# Patient Record
Sex: Male | Born: 1945 | Race: White | Hispanic: No | Marital: Married | Smoking: Never smoker
Health system: Southern US, Community
[De-identification: ages and names within clinical notes are randomized; demographics above are authoritative.]

## PROBLEM LIST (undated history)

## (undated) DIAGNOSIS — I4891 Unspecified atrial fibrillation: Secondary | ICD-10-CM

## (undated) DIAGNOSIS — I10 Essential (primary) hypertension: Secondary | ICD-10-CM

## (undated) DIAGNOSIS — I739 Peripheral vascular disease, unspecified: Secondary | ICD-10-CM

## (undated) DIAGNOSIS — I639 Cerebral infarction, unspecified: Secondary | ICD-10-CM

## (undated) DIAGNOSIS — F419 Anxiety disorder, unspecified: Secondary | ICD-10-CM

## (undated) DIAGNOSIS — E119 Type 2 diabetes mellitus without complications: Secondary | ICD-10-CM

## (undated) DIAGNOSIS — E785 Hyperlipidemia, unspecified: Secondary | ICD-10-CM

## (undated) DIAGNOSIS — I499 Cardiac arrhythmia, unspecified: Secondary | ICD-10-CM

## (undated) HISTORY — DX: Anxiety disorder, unspecified: F41.9

## (undated) HISTORY — DX: Type 2 diabetes mellitus without complications: E11.9

## (undated) HISTORY — DX: Cerebral infarction, unspecified: I63.9

## (undated) HISTORY — DX: Hyperlipidemia, unspecified: E78.5

## (undated) HISTORY — DX: Essential (primary) hypertension: I10

---

## 1999-12-23 HISTORY — PX: CHOLECYSTECTOMY: SHX55

## 2009-02-01 ENCOUNTER — Ambulatory Visit: Payer: Self-pay | Admitting: Internal Medicine

## 2009-12-23 ENCOUNTER — Emergency Department: Payer: Self-pay | Admitting: Emergency Medicine

## 2010-03-08 ENCOUNTER — Ambulatory Visit: Payer: Self-pay | Admitting: General Surgery

## 2010-03-08 HISTORY — PX: COLONOSCOPY: SHX174

## 2010-07-28 ENCOUNTER — Emergency Department: Payer: Self-pay | Admitting: Internal Medicine

## 2010-08-07 ENCOUNTER — Emergency Department: Payer: Self-pay | Admitting: Emergency Medicine

## 2013-12-03 ENCOUNTER — Ambulatory Visit: Payer: Self-pay | Admitting: Neurology

## 2013-12-03 ENCOUNTER — Emergency Department: Payer: Self-pay | Admitting: Emergency Medicine

## 2013-12-03 DIAGNOSIS — I639 Cerebral infarction, unspecified: Secondary | ICD-10-CM

## 2013-12-03 HISTORY — DX: Cerebral infarction, unspecified: I63.9

## 2013-12-03 LAB — CBC WITH DIFFERENTIAL/PLATELET
Basophil #: 0.1 10*3/uL (ref 0.0–0.1)
Basophil %: 1.4 %
Eosinophil %: 4.1 %
HCT: 47.2 % (ref 40.0–52.0)
Lymphocyte %: 23.7 %
MCH: 29.4 pg (ref 26.0–34.0)
Monocyte #: 0.7 x10 3/mm (ref 0.2–1.0)
Monocyte %: 10.3 %
Neutrophil #: 4.2 10*3/uL (ref 1.4–6.5)
Neutrophil %: 60.5 %
RDW: 14.3 % (ref 11.5–14.5)

## 2013-12-03 LAB — APTT: Activated PTT: 30.3 secs (ref 23.6–35.9)

## 2013-12-03 LAB — COMPREHENSIVE METABOLIC PANEL
Alkaline Phosphatase: 92 U/L
BUN: 10 mg/dL (ref 7–18)
Chloride: 107 mmol/L (ref 98–107)
Osmolality: 281 (ref 275–301)
SGPT (ALT): 27 U/L (ref 12–78)
Sodium: 140 mmol/L (ref 136–145)

## 2013-12-03 LAB — PROTIME-INR: Prothrombin Time: 12.9 secs (ref 11.5–14.7)

## 2013-12-03 LAB — TROPONIN I: Troponin-I: <0.02 ng/mL

## 2013-12-04 ENCOUNTER — Inpatient Hospital Stay (HOSPITAL_COMMUNITY): Payer: BC Managed Care – PPO

## 2013-12-04 ENCOUNTER — Inpatient Hospital Stay (HOSPITAL_COMMUNITY)
Admission: AD | Admit: 2013-12-04 | Discharge: 2013-12-06 | DRG: 064 | Disposition: A | Discharge status: Home Health | Payer: BC Managed Care – PPO | Source: Other Acute Inpatient Hospital | Attending: Neurology | Admitting: Neurology

## 2013-12-04 ENCOUNTER — Encounter (HOSPITAL_COMMUNITY): Payer: Self-pay | Admitting: Radiology

## 2013-12-04 DIAGNOSIS — R4701 Aphasia: Secondary | ICD-10-CM | POA: Diagnosis present

## 2013-12-04 DIAGNOSIS — I639 Cerebral infarction, unspecified: Secondary | ICD-10-CM

## 2013-12-04 DIAGNOSIS — Z7982 Long term (current) use of aspirin: Secondary | ICD-10-CM

## 2013-12-04 DIAGNOSIS — R339 Retention of urine, unspecified: Secondary | ICD-10-CM | POA: Diagnosis present

## 2013-12-04 DIAGNOSIS — E119 Type 2 diabetes mellitus without complications: Secondary | ICD-10-CM | POA: Diagnosis present

## 2013-12-04 DIAGNOSIS — T45615A Adverse effect of thrombolytic drugs, initial encounter: Secondary | ICD-10-CM | POA: Diagnosis present

## 2013-12-04 DIAGNOSIS — I10 Essential (primary) hypertension: Secondary | ICD-10-CM | POA: Diagnosis present

## 2013-12-04 DIAGNOSIS — N138 Other obstructive and reflux uropathy: Secondary | ICD-10-CM | POA: Diagnosis present

## 2013-12-04 DIAGNOSIS — Z9282 Status post administration of tPA (rtPA) in a different facility within the last 24 hours prior to admission to current facility: Secondary | ICD-10-CM

## 2013-12-04 DIAGNOSIS — G8190 Hemiplegia, unspecified affecting unspecified side: Secondary | ICD-10-CM | POA: Diagnosis present

## 2013-12-04 DIAGNOSIS — IMO0002 Reserved for concepts with insufficient information to code with codable children: Secondary | ICD-10-CM | POA: Diagnosis not present

## 2013-12-04 DIAGNOSIS — Z823 Family history of stroke: Secondary | ICD-10-CM

## 2013-12-04 DIAGNOSIS — I619 Nontraumatic intracerebral hemorrhage, unspecified: Secondary | ICD-10-CM | POA: Diagnosis present

## 2013-12-04 DIAGNOSIS — I4891 Unspecified atrial fibrillation: Secondary | ICD-10-CM | POA: Diagnosis present

## 2013-12-04 DIAGNOSIS — Z79899 Other long term (current) drug therapy: Secondary | ICD-10-CM

## 2013-12-04 DIAGNOSIS — I369 Nonrheumatic tricuspid valve disorder, unspecified: Secondary | ICD-10-CM

## 2013-12-04 DIAGNOSIS — Z23 Encounter for immunization: Secondary | ICD-10-CM

## 2013-12-04 DIAGNOSIS — N401 Enlarged prostate with lower urinary tract symptoms: Secondary | ICD-10-CM | POA: Diagnosis present

## 2013-12-04 DIAGNOSIS — R319 Hematuria, unspecified: Secondary | ICD-10-CM | POA: Diagnosis not present

## 2013-12-04 DIAGNOSIS — I6350 Cerebral infarction due to unspecified occlusion or stenosis of unspecified cerebral artery: Secondary | ICD-10-CM

## 2013-12-04 DIAGNOSIS — I6340 Cerebral infarction due to embolism of unspecified cerebral artery: Secondary | ICD-10-CM | POA: Diagnosis present

## 2013-12-04 HISTORY — DX: Unspecified atrial fibrillation: I48.91

## 2013-12-04 LAB — CBC WITH DIFFERENTIAL/PLATELET
Basophils Absolute: 0 10*3/uL (ref 0.0–0.1)
Basophils Relative: 1 % (ref 0–1)
Lymphocytes Relative: 12 % (ref 12–46)
MCHC: 36.2 g/dL — ABNORMAL HIGH (ref 30.0–36.0)
Monocytes Relative: 7 % (ref 3–12)
Neutro Abs: 5.6 10*3/uL (ref 1.7–7.7)
Neutrophils Relative %: 79 % — ABNORMAL HIGH (ref 43–77)
Platelets: 161 10*3/uL (ref 150–400)
RDW: 13.4 % (ref 11.5–15.5)
WBC: 7.1 10*3/uL (ref 4.0–10.5)

## 2013-12-04 LAB — BASIC METABOLIC PANEL
BUN: 10 mg/dL (ref 6–23)
Calcium: 8.6 mg/dL (ref 8.4–10.5)
Creatinine, Ser: 0.78 mg/dL (ref 0.50–1.35)
GFR calc Af Amer: >90 mL/min (ref >90)
GFR calc non Af Amer: >90 mL/min (ref >90)

## 2013-12-04 LAB — MRSA PCR SCREENING: MRSA by PCR: NEGATIVE

## 2013-12-04 LAB — LIPID PANEL
Cholesterol: 160 mg/dL (ref 0–200)
HDL: 35 mg/dL — ABNORMAL LOW (ref >39)
LDL Cholesterol: 104 mg/dL — ABNORMAL HIGH (ref 0–99)
Total CHOL/HDL Ratio: 4.6 RATIO
Triglycerides: 106 mg/dL (ref <150)
VLDL: 21 mg/dL (ref 0–40)

## 2013-12-04 MED ORDER — PNEUMOCOCCAL VAC POLYVALENT 25 MCG/0.5ML IJ INJ
0.5000 mL | INJECTION | INTRAMUSCULAR | Status: AC
  Administered 2013-12-05: 0.5 mL via INTRAMUSCULAR
  Filled 2013-12-04: qty 0.5

## 2013-12-04 MED ORDER — LABETALOL HCL 5 MG/ML IV SOLN
10.0000 mg | INTRAVENOUS | Status: DC | PRN
  Administered 2013-12-04 (×2): 10 mg via INTRAVENOUS
  Filled 2013-12-04 (×3): qty 4

## 2013-12-04 MED ORDER — IOHEXOL 350 MG/ML SOLN
100.0000 mL | Freq: Once | INTRAVENOUS | Status: AC | PRN
  Administered 2013-12-04: 100 mL via INTRAVENOUS

## 2013-12-04 MED ORDER — PANTOPRAZOLE SODIUM 40 MG IV SOLR
40.0000 mg | Freq: Every day | INTRAVENOUS | Status: DC
  Administered 2013-12-04 (×2): 40 mg via INTRAVENOUS
  Filled 2013-12-04 (×3): qty 40

## 2013-12-04 MED ORDER — ACETAMINOPHEN 325 MG PO TABS: 650.0000 mg | ORAL_TABLET | ORAL | Status: DC | PRN

## 2013-12-04 MED ORDER — METOPROLOL TARTRATE 1 MG/ML IV SOLN
5.0000 mg | INTRAVENOUS | Status: DC | PRN
  Administered 2013-12-05: 5 mg via INTRAVENOUS
  Filled 2013-12-04 (×2): qty 5

## 2013-12-04 MED ORDER — ACETAMINOPHEN 650 MG RE SUPP: 650.0000 mg | RECTAL | Status: DC | PRN

## 2013-12-04 MED ORDER — SODIUM CHLORIDE 0.9 % IV SOLN
INTRAVENOUS | Status: DC
  Administered 2013-12-04 (×2): via INTRAVENOUS
  Administered 2013-12-04: 1000 mL via INTRAVENOUS
  Administered 2013-12-06: 02:00:00 via INTRAVENOUS

## 2013-12-04 MED ORDER — METOPROLOL TARTRATE 1 MG/ML IV SOLN
INTRAVENOUS | Status: AC
  Administered 2013-12-04: 5 mg
  Filled 2013-12-04: qty 5

## 2013-12-04 NOTE — Progress Notes (Signed)
Notified Dr. Roseanne Reno of completion of MRI and new onset of hematuria. Hematuria is mild, pink colored urine. No c/o pain or discomfort. Awaiting order related to aspirin administration.

## 2013-12-04 NOTE — Progress Notes (Signed)
Echocardiogram 2D Echocardiogram has been performed.  Devon Lam 12/04/2013, 9:24 AM

## 2013-12-04 NOTE — Progress Notes (Signed)
Stroke Team Rounding Devon Lam is a 67 y.o. male with a history of atrial fibrillation who was in his normal state of health at 7 pm when his family talked to him, then when his wife arrrived home at 7:30pm found him to be confused. He was brought to [facility] ED where he was seen to be aphasic and therefore tPA was administered. He was given tPA at 22:32.  He was transferred here following this. On arrival, I accompanied the patient to CT angiogram which did not reveal any clear intervenable lesions and therefore no further acute therapy was pursued.   LKW: 7pm  NIH: 8  tpa given?: yes [facility], 12/03/13 @ 22:32)  Subjective: Did not pass bedside swallow or SLP evaluation. Comfortable. No new events. No headache or bleeding.  Objective: Vital signs in last 24 hours: Filed Vitals:   12/04/13 0700 12/04/13 0800 12/04/13 0900 12/04/13 1000  BP: 146/90 152/87 153/109 153/107  Pulse: 111 85 88 104  Temp:  97.5 F (36.4 C)    TempSrc:  Oral    Resp: 16 16 21 21   Height:      Weight:      SpO2: 95% 99% 97% 96%    Temp (24hrs), Avg:97.6 F (36.4 C), Min:97.5 F (36.4 C), Max:97.6 F (36.4 C)    Intake/Output from previous day: 12/13 0701 - 12/14 0700 In: 432.5 [I.V.:432.5] Out: 125 [Urine:125]  GENERAL EXAM: Patient is in no distress; well developed, nourished and groomed  CARDIOVASCULAR: Regular rate and rhythm, no murmurs, no carotid bruits  NEUROLOGIC: MENTAL STATUS: awake, alert, SIGNIFICANT EXPRESSIVE APHASIA. ABLE TO NAME PEN. CANNOT TELL ME HIS NAME, BUT ABLE TO REPEAT HIS NAME. COMPREHENSION INTACT TO SIMPLE COMMANDS, BUT CANNOT TOUCH HIS OWN NOSE TO COMMAND.  CRANIAL NERVE: pupils equal and reactive to light, visual fields full to confrontation, extraocular muscles intact, no nystagmus, facial sensation and strength symmetric, hearing intact, palate elevates symmetrically, uvula midline, shoulder shrug symmetric, tongue midline. MOTOR: normal bulk and tone, full  strength in the BUE, BLE SENSORY: normal and symmetric to light touch COORDINATION: DIFFICULTY FOLLOWING COMMANDS TO TEST COORDINATION REFLEXES: deep tendon reflexes present and symmetric GAIT/STATION: IN BED, SITTING UP  Lab Results:  Recent Labs  12/04/13 0526  WBC 7.1  HGB 15.5  HCT 42.8  PLT 161    BMET  Recent Labs  12/04/13 0526  NA 140  K 3.9  CL 107  CO2 23  GLUCOSE 122*  BUN 10  CREATININE 0.78  CALCIUM 8.6    LIPIDS:    Component Value Date/Time   CHOL 160 12/04/2013 0536   TRIG 106 12/04/2013 0536   HDL 35* 12/04/2013 0536   CHOLHDL 4.6 12/04/2013 0536   VLDL 21 12/04/2013 0536   LDLCALC 104* 12/04/2013 0536    HEMOGLOBIN A1C: No results found for this basename: HGBA1C     Studies/Results:  I reviewed images myself and agree with interpretation. -VRP   Ct Angio Head W/cm &/or Wo Cm  12/04/2013   CLINICAL DATA:  Stroke  EXAM: CT ANGIOGRAPHY HEAD AND NECK  TECHNIQUE: Multidetector CT imaging of the head and neck was performed using the standard protocol during bolus administration of intravenous contrast. Multiplanar CT image reconstructions including MIPs were obtained to evaluate the vascular anatomy. Carotid stenosis measurements (when applicable) are obtained utilizing NASCET criteria, using the distal internal carotid diameter as the denominator.  CONTRAST:  OMNIPAQUE IOHEXOL 350 MG/ML SOLN  COMPARISON:  Prior noncontrast head CT from earlier  the same day.  FINDINGS: CTA HEAD FINDINGS  Precontrast images of the brain are stable in appearance without evidence of definite large vessel territory infarct. No acute intracranial hemorrhage. No mass or midline shift. No hydrocephalus. Polypoid opacity present within the for of the left maxillary sinus.  The petrous, cavernous, and supra clinoid segments of the internal carotid arteries are well opacified and widely patent bilaterally. A1 segments, anterior communicating artery, and anterior cerebral  arteries are widely patent. The M1 segments and middle cerebral artery branches bilaterally are well opacified. No intraluminal clot or proximal branch occlusion identified. Posterior communicating arteries are within normal limits. No intracranial aneurysm seen within the anterior circulation.  Intracranial portions of the distal vertebral arteries are somewhat tortuous without high-grade stenosis. Posterior inferior cerebellar arteries are well opacified. The vertebrobasilar junction and basilar artery are normal. Posterior cerebral arteries, superior cerebellar arteries, and anterior inferior cerebral arteries are opacified without evidence of high-grade stenosis or other abnormality. No intracranial aneurysm seen within the posterior circulation.  Review of the MIP images confirms the above findings.  CTA NECK FINDINGS  Visualized aortic arch demonstrates a normal appearance with normal 3 vessel morphology. No high-grade stenosis seen at the origin of the great vessels. The subclavian arteries are widely patent.  Tortuosity of the proximal left common carotid artery is noted. Common carotid arteries are symmetric in caliber without high-grade stenosis or dissection. Carotid bifurcations are normal.  The internal carotid arteries are symmetric in caliber without evidence of high-grade stenosis, dissection, or other abnormality.  The external carotid arteries and their branch vessels are well opacified.  The vertebral arteries are symmetric in size and caliber without high-grade stenosis or dissection  Review of the MIP images confirms the above findings.  Visualized soft tissues of the neck are within normal limits. No mass lesion, adenopathy, or loculated fluid collection. Visualized superior mediastinum is unremarkable. Lungs apices are clear. No osseous abnormality.  IMPRESSION: 1. Normal CTA of the brain without evidence of proximal arterial occlusion, intraluminal clot, high-grade flow-limiting stenosis, or  intracranial aneurysm.  2. Unremarkable CT of the neck without evidence of high-grade stenosis or dissection.   Electronically Signed   By: Rise Mu M.D.   On: 12/04/2013 01:27   Ct Angio Neck W/cm &/or Wo/cm  12/04/2013   CLINICAL DATA:  Stroke  EXAM: CT ANGIOGRAPHY HEAD AND NECK  TECHNIQUE: Multidetector CT imaging of the head and neck was performed using the standard protocol during bolus administration of intravenous contrast. Multiplanar CT image reconstructions including MIPs were obtained to evaluate the vascular anatomy. Carotid stenosis measurements (when applicable) are obtained utilizing NASCET criteria, using the distal internal carotid diameter as the denominator.  CONTRAST:  OMNIPAQUE IOHEXOL 350 MG/ML SOLN  COMPARISON:  Prior noncontrast head CT from earlier the same day.  FINDINGS: CTA HEAD FINDINGS  Precontrast images of the brain are stable in appearance without evidence of definite large vessel territory infarct. No acute intracranial hemorrhage. No mass or midline shift. No hydrocephalus. Polypoid opacity present within the for of the left maxillary sinus.  The petrous, cavernous, and supra clinoid segments of the internal carotid arteries are well opacified and widely patent bilaterally. A1 segments, anterior communicating artery, and anterior cerebral arteries are widely patent. The M1 segments and middle cerebral artery branches bilaterally are well opacified. No intraluminal clot or proximal branch occlusion identified. Posterior communicating arteries are within normal limits. No intracranial aneurysm seen within the anterior circulation.  Intracranial portions of the distal  vertebral arteries are somewhat tortuous without high-grade stenosis. Posterior inferior cerebellar arteries are well opacified. The vertebrobasilar junction and basilar artery are normal. Posterior cerebral arteries, superior cerebellar arteries, and anterior inferior cerebral arteries are  opacified without evidence of high-grade stenosis or other abnormality. No intracranial aneurysm seen within the posterior circulation.  Review of the MIP images confirms the above findings.  CTA NECK FINDINGS  Visualized aortic arch demonstrates a normal appearance with normal 3 vessel morphology. No high-grade stenosis seen at the origin of the great vessels. The subclavian arteries are widely patent.  Tortuosity of the proximal left common carotid artery is noted. Common carotid arteries are symmetric in caliber without high-grade stenosis or dissection. Carotid bifurcations are normal.  The internal carotid arteries are symmetric in caliber without evidence of high-grade stenosis, dissection, or other abnormality.  The external carotid arteries and their branch vessels are well opacified.  The vertebral arteries are symmetric in size and caliber without high-grade stenosis or dissection  Review of the MIP images confirms the above findings.  Visualized soft tissues of the neck are within normal limits. No mass lesion, adenopathy, or loculated fluid collection. Visualized superior mediastinum is unremarkable. Lungs apices are clear. No osseous abnormality.  IMPRESSION: 1. Normal CTA of the brain without evidence of proximal arterial occlusion, intraluminal clot, high-grade flow-limiting stenosis, or intracranial aneurysm.  2. Unremarkable CT of the neck without evidence of high-grade stenosis or dissection.   Electronically Signed   By: Rise Mu M.D.   On: 12/04/2013 01:27   Dg Chest Port 1 View  12/04/2013   CLINICAL DATA:  History of CVA  EXAM: PORTABLE CHEST - 1 VIEW  COMPARISON:  12/24/2009  FINDINGS: Low lung volumes. Cardiac silhouette enlarged. There is prominence of interstitial markings significant peribronchial cuffing. No focal regions of consolidation or focal infiltrates. The osseous structures are unremarkable.  IMPRESSION: Mild prominence of the interstitial markings accentuated by  the patient's low lung volumes. Component of pulmonary vascular congestion is of diagnostic consideration. No focal region of consolidation or focal infiltrates.   Electronically Signed   By: Salome Holmes M.D.   On: 12/04/2013 08:08   12/04/13 CTA head/neck  1. Normal CTA of the brain without evidence of proximal arterial occlusion, intraluminal clot, high-grade flow-limiting stenosis, or intracranial aneurysm. 2. Unremarkable CT of the neck without evidence of high-grade stenosis or dissection.  MRI brain  TTE  Medications:  . pantoprazole (PROTONIX) IV  40 mg Intravenous QHS   . sodium chloride 75 mL/hr at 12/04/13 0725     Assessment: 67 y.o. male with atrial fibrillation, presenting with confusion/aphasia, s/p IV TPA. 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 Was on aspirin prior to admission.  Stroke Risk Factors - atrial fibrillation and hypertension   LOS: 0 days   Plan: - check MRI brain - check lipid panel, A1c - Risk factor modification (goal SBP < 180, goal LDL < 100, goal A1c < 7) - Permissive HTN x 24-48 hours post stroke, then gradually reduce - Antiplatelet/Anticoagulation therapy: will check MRI today, and then CT head in the evening; then start plavix vs anticoagulation (based on size of stroke, and whether there is presence of hemorrhagic transformation, on 24 hour post-TPA imaging) - Maintain euvolemia, euglycemia, euthermia - Telemetry - Frequent neuro checks - PT consult, OT consult, Speech consult, when able to participate   Suanne Marker, MD 12/04/2013, 10:30 AM Certified in Neurology, Neurophysiology and Neuroimaging [facility] -  Stroke Team  Please refer to [email or web address] for on-call Stroke MD

## 2013-12-04 NOTE — Evaluation (Signed)
Clinical/Bedside Swallow Evaluation Patient Details  Name: Devon Lam MRN: [id number] Date of Birth: 07-17-1946  Today's Date: 12/04/2013 Time: 1015-1045 SLP Time Calculation (min): 30 min  Past Medical History:  Past Medical History  Diagnosis Date  . A-fib    Past Surgical History: History reviewed. No pertinent past surgical history. HPI:  Devon Lam is a 67 y.o. male with a history of atrial fibrillation who was in his normal state of health at 7 pm when his family talked to him, then when his wife arrrived home at 7:30pm found him to be confused. He was brought to [facility] ED where he was seen to be aphasic and therefore tPA was administered. He was given tPA at 22:32.  MRI and CT pending.   BSE ordered per Stroke Protocol.    Assessment / Plan / Recommendation Clinical Impression  BSE completed.  Moderate oral dysphagia marked by poor oral awareness and reduced lingual and labial coordination.  Tactile and visual cues required for labial closure around spoon.    Oropharyngeal dysphagia indicated due to decreased sensory with inconsistent iniation of the swallow with PO trials. Suspect penetration before swallow with eventual suspected aspiration (wet cough) during the swallow of thin water by cup due to noted delay in initiation.   Cognitively unable to complete volitional throat clear and cough further decreasing ability to protect airway with PO's.  Recommend to continue NPO status with exception of ice chips and water administered by spoon s/p oral care.  ST to reassess swallow on 12/05/13 for PO readiness.      Aspiration Risk  Moderate    Diet Recommendation NPO   Medication Administration: Via alternative means    Other  Recommendations Oral Care Recommendations: Oral care Q4 per protocol   Follow Up Recommendations   (TBD pending OT/PT evaluations)    Frequency and Duration min 2x/week  2 weeks       SLP Swallow Goals  See ST Goals in Care Plans     Swallow Study Prior Functional Status   Lived at home with no prior history of dysphagia     General Date of Onset: 12/04/13 HPI: Devon Lam is a 67 y.o. male with a history of atrial fibrillation who was in his normal state of health at 7 pm when his family talked to him, then when his wife arrrived home at 7:30pm found him to be confused. He was brought to [facility] ED where he was seen to be aphasic and therefore tPA was administered. He was given tPA at 22:32. Type of Study: Bedside swallow evaluation Diet Prior to this Study: NPO Temperature Spikes Noted: No Respiratory Status: Room air History of Recent Intubation: No Behavior/Cognition: Alert;Cooperative;Doesn't follow directions;Decreased sustained attention Oral Cavity - Dentition: Adequate natural dentition Self-Feeding Abilities: Needs assist Patient Positioning: Upright in bed Baseline Vocal Quality: Clear Volitional Cough: Cognitively unable to elicit Volitional Swallow: Unable to elicit    Oral/Motor/Sensory Function Overall Oral Motor/Sensory Function: Impaired Labial ROM: Reduced left Labial Symmetry: Abnormal symmetry left Labial Strength: Reduced Labial Sensation: Reduced Lingual ROM: Within Functional Limits Lingual Symmetry: Abnormal symmetry right Lingual Strength: Within Functional Limits Lingual Sensation: Reduced Facial ROM: Within Functional Limits Facial Symmetry: Within Functional Limits Facial Strength: Reduced Facial Sensation: Reduced Velum: Within Functional Limits Mandible: Within Functional Limits   Ice Chips Ice chips: Impaired Oral Phase Impairments: Impaired mastication;Poor awareness of bolus;Impaired anterior to posterior transit;Reduced lingual movement/coordination Oral Phase Functional Implications: Prolonged oral transit Pharyngeal Phase Impairments: Suspected delayed Swallow  Thin Liquid Thin Liquid: Impaired Presentation: Cup;Spoon Pharyngeal  Phase Impairments: Suspected  delayed Swallow;Decreased hyoid-laryngeal movement;Throat Clearing - Immediate    Nectar Thick Nectar Thick Liquid: Impaired Presentation: Spoon;Cup Oral Phase Impairments: Reduced lingual movement/coordination Oral phase functional implications: Prolonged oral transit Pharyngeal Phase Impairments: Suspected delayed Swallow;Decreased hyoid-laryngeal movement   Honey Thick Honey Thick Liquid: Not tested   Puree Puree: Impaired Oral Phase Functional Implications: Prolonged oral transit;Oral holding Pharyngeal Phase Impairments: Suspected delayed Swallow;Decreased hyoid-laryngeal movement   Solid   GO    Solid: Not tested      Moreen Fowler MS, CCC-SLP [phone] [facility] 12/04/2013,11:05 AM

## 2013-12-04 NOTE — H&P (Signed)
Neurology H&P  CC: Aphasia  History is obtained from: wife, referring MDs  HPI: Devon Lam is a 67 y.o. male with a history of atrial fibrillation who was in his normal state of health at 7 pm when his family talked to him, then when his wife arrrived home at 7:30pm found him to be confused. He was brought to [facility] ED where he was seen to be aphasic and therefore tPA was administered. He was given tPA at 22:32.  He was transferred here following this. On arrival, I accompanied the patient to CT angiogram which did not reveal any clear intervenable lesions and therefore no further acute therapy was pursued.   LKW: 7pm NIH: 8 tpa given?: yes    ROS: Unable to assess secondary to patient's altered mental status.    PMH:  Afib  NKDA  Home medications: Metoprolol 50mg  BId ASA 325mg  QD  Family History: Mother - stroke  Social History: Tob: none  Exam: Current vital signs: BP 154/102  Pulse 114  Resp 18  SpO2 96% Vital signs in last 24 hours: Pulse Rate:  [114-116] 114 (12/14 0100) Resp:  [18-19] 18 (12/14 0100) BP: (154-162)/(96-102) 154/102 mmHg (12/14 0100) SpO2:  [94 %-96 %] 96 % (12/14 0100)  General: in bed, appears awake HEENT: No gross lesions CV: irregular, tachycardic Abd: NT, ND Resp: non-laboured breathing Ext: no edema.  Mental Status: Patient is awake, alert, globally aphasic. Verbalizes just a single sound. Tries to write, and writes a couple of words, but nonsensical. He follows command to close eyes without prompting, squeezes hand with verbal prompting. No other commands are followed  Cranial Nerves: II: Blinks to threat bilaterally. Pupils are equal, round, and reactive to light.   III,IV, VI: EOMI without ptosis or diploplia.  V: Facial sensation is apparently symmetric VII: Facial movement is symmetric.  VIII: hearing is intact to voice X: Uvula elevates symmetrically XI: Shoulder shrug is symmetric. XII: will not protrude  tongue Motor: Tone is normal. Bulk is normal. 5/5 strength was present in all four extremities.  Sensory: Winced more to pinch on left than right.  Deep Tendon Reflexes: 2+ and symmetric in the biceps and patellae.  Cerebellar: FNF without dysmetria intact bilaterally Gait: Not tested 2/2 recent tpa  I have reviewed labs in epic and the results pertinent to this consultation are: Cr 0.9  Na 140 K 3.7 CO2 28 Calcium 9.1 T Bili 1.1 Troponin-I negative WBC 6.9 HCT 47.2 Plt 184  I have reviewed the images obtained:CT angio - no clear stroke on CT, no clear occlusive lesion on CTA  Impression: 67 yo M with sudden onset aphasia in the setting of atrial fibrillation on ASA monotherapy. This most likely represents a small MCA branch occlusion not seen on CTA, though reperfusion by tPA is possible.   Recommendations: 1. HgbA1c, fasting lipid panel 2. MRI, MRA  of the brain without contrast 3. Frequent neuro checks 4. Echocardiogram 5. Carotid dopplers 6. Prophylactic therapy-None 7. Risk factor modification 8. Telemetry monitoring 9. PT consult, OT consult, Speech consult 10. PRN lopressor for afib/tachycardia    This patient is critically ill and at significant risk of neurological worsening, death and care requires constant monitoring of vital signs, hemodynamics,respiratory and cardiac monitoring, neurological assessment, discussion with family, other specialists and medical decision making of high complexity. I spent 60 minutes of neurocritical care time  in the care of  this patient.  Ritta Slot, MD [facility] [phone]  If 7pm- 7am, please  page neurology on call at [phone].  12/04/2013  1:39 AM

## 2013-12-04 NOTE — Progress Notes (Signed)
Pt having difficulty urinating.  Bladder Scanned showed . Paged Neuro and Dr. Amada Jupiter returned call.  MD advised to watch pt and notify him if urinating becomes emergent.  No orders given at this time.

## 2013-12-04 NOTE — Evaluation (Signed)
Speech Language Pathology Evaluation Patient Details Name: Devon Lam MRN: [id number] DOB: 1946-12-18 Today's Date: 12/04/2013 Time: 4098-1191 SLP Time Calculation (min): 30 min  Problem List:  Patient Active Problem List   Diagnosis Date Noted  . Stroke 12/04/2013   Past Medical History:  Past Medical History  Diagnosis Date  . A-fib    Past Surgical History: History reviewed. No pertinent past surgical history. HPI:  Devon Lam is a 67 y.o. male with a history of atrial fibrillation who was in his normal state of health at 7 pm when his family talked to him, then when his wife arrrived home at 7:30pm found him to be confused. He was brought to [facility] ED where he was seen to be aphasic and therefore tPA was administered. He was given tPA at 22:32.     Assessment / Plan / Recommendation Clinical Impression  Cognitive Linguistic Evaluation completed per Stroke Protocol.  Indicates receptive and expressive aphasia with auditory comprehension relatively better that speech production.  Yes/no questions to Anomia present during confrontational naming but able to write target words to dictation.   Severity of cognitive status difficult to determine due to aphasia.  Difficulty with left/right differentiation with question of limb apraxia.  Recommend further Skilled ST treatment in acute care setting to address communication and cognitive deficits.    SLP Assessment  Patient needs continued Speech Lanaguage Pathology Services    Follow Up Recommendations   (TBD pending OT/PT evaluations)    Frequency and Duration min 2x/week  2 weeks      SLP Goals  SLP Goals Potential to Achieve Goals: Good  SLP Evaluation Prior Functioning  Cognitive/Linguistic Baseline: Within functional limits Type of Home: House  Lives With: Spouse Available Help at Discharge: Available PRN/intermittently Education: Highschool Diploma, [employer]  Vocation: Full time employment    Cognition  Overall Cognitive Status: Impaired/Different from baseline Arousal/Alertness: Awake/alert Orientation Level: Other (comment) (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) Attention: Focused Focused Attention: Impaired Focused Attention Impairment: Verbal basic;Verbal complex;Functional basic Memory: Appears intact Awareness: Impaired Awareness Impairment: Intellectual impairment Problem Solving: Impaired Problem Solving Impairment: Verbal basic;Functional basic Executive Function: Sequencing;Organizing;Initiating;Self Monitoring Reasoning: Impaired Reasoning Impairment: Verbal basic;Functional basic Sequencing: Impaired Sequencing Impairment: Verbal basic;Functional basic Organizing: Impaired Organizing Impairment: Verbal basic;Functional basic Initiating: Impaired Initiating Impairment: Verbal basic;Functional basic Self Monitoring: Impaired Self Monitoring Impairment: Verbal basic;Functional basic Safety/Judgment: Impaired    Comprehension  Auditory Comprehension Overall Auditory Comprehension: Impaired Yes/No Questions: Impaired Complex Questions: 0-24% accurate Paragraph Comprehension (via yes/no questions): 0-25% accurate Other Yes/No Questions Comments`: Moderate Commands: Impaired One Step Basic Commands: 0-24% accurate Two Step Basic Commands: 0-24% accurate Multistep Basic Commands: 0-24% accurate Interfering Components: Attention;Processing speed EffectiveTechniques: Extra processing time;Visual/Gestural cues;Repetition Visual Recognition/Discrimination Discrimination: Exceptions to [facility] L/R Discrimination: Unable to indentify Reading Comprehension Reading Status: Impaired Word level: Within functional limits Sentence Level: Impaired Paragraph Level: Impaired Functional Environmental (signs, name badge): Within functional limits Interfering Components: Attention;Left neglect/inattention;Processing time;Eye glasses not available Effective Techniques: Verbal  cueing;Visual cueing    Expression Expression Primary Mode of Expression: Nonverbal - gestures Verbal Expression Overall Verbal Expression: Impaired Initiation: Impaired Automatic Speech: Social Response;Counting;Day of week;Month of year Level of Generative/Spontaneous Verbalization: Word Repetition: Impaired Level of Impairment: Word level;Phrase level;Sentence level Naming: Impairment Responsive: 0-25% accurate Confrontation: Impaired Convergent: 0-24% accurate Divergent: 0-24% accurate Verbal Errors: Semantic paraphasias;Neologisms;Perseveration;Jargon;Not aware of errors Effective Techniques: Open ended questions;Semantic cues;Written cues Non-Verbal Means of Communication: Gestures;Writing Written Expression Dominant Hand: Right Written Expression: Exceptions to [facility] Dictation Ability:  Word   Oral / Motor Oral Motor/Sensory Function Overall Oral Motor/Sensory Function: Impaired Labial ROM: Reduced left Labial Symmetry: Abnormal symmetry left Labial Strength: Reduced Labial Sensation: Reduced Lingual ROM: Within Functional Limits Lingual Symmetry: Abnormal symmetry right Lingual Strength: Within Functional Limits Lingual Sensation: Reduced Facial ROM: Within Functional Limits Facial Symmetry: Within Functional Limits Facial Strength: Reduced Facial Sensation: Reduced Velum: Within Functional Limits Mandible: Within Functional Limits Motor Speech Overall Motor Speech: Impaired Resonance: Within functional limits Articulation: Impaired Level of Impairment: Word Intelligibility: Intelligibility reduced Word: 0-24% accurate Phrase: 0-24% accurate Sentence: 0-24% accurate Conversation: 0-24% accurate Motor Planning: Impaired Level of Impairment: Word Motor Speech Errors: Unaware Effective Techniques: Slow rate   GO    Moreen Fowler, M.S., CCC-SLP [phone] [facility] 12/04/2013, 1:14 PM

## 2013-12-05 DIAGNOSIS — I10 Essential (primary) hypertension: Secondary | ICD-10-CM | POA: Diagnosis present

## 2013-12-05 DIAGNOSIS — R4701 Aphasia: Secondary | ICD-10-CM | POA: Diagnosis present

## 2013-12-05 DIAGNOSIS — I4891 Unspecified atrial fibrillation: Secondary | ICD-10-CM | POA: Diagnosis present

## 2013-12-05 MED ORDER — LORAZEPAM 2 MG/ML IJ SOLN
0.5000 mg | Freq: Once | INTRAMUSCULAR | Status: AC
  Administered 2013-12-05: 0.5 mg via INTRAVENOUS

## 2013-12-05 MED ORDER — METOPROLOL TARTRATE 50 MG PO TABS
50.0000 mg | ORAL_TABLET | Freq: Two times a day (BID) | ORAL | Status: DC
  Administered 2013-12-05 – 2013-12-06 (×3): 50 mg via ORAL
  Filled 2013-12-05 (×5): qty 1

## 2013-12-05 MED ORDER — ASPIRIN 300 MG RE SUPP
300.0000 mg | Freq: Every day | RECTAL | Status: DC
  Administered 2013-12-05 – 2013-12-06 (×2): 300 mg via RECTAL
  Filled 2013-12-05 (×2): qty 1

## 2013-12-05 MED ORDER — LORAZEPAM 2 MG/ML IJ SOLN
INTRAMUSCULAR | Status: AC
  Filled 2013-12-05: qty 1

## 2013-12-05 MED ORDER — LORAZEPAM 2 MG/ML IJ SOLN
0.5000 mg | Freq: Once | INTRAMUSCULAR | Status: AC
  Administered 2013-12-05: 0.5 mg via INTRAVENOUS
  Filled 2013-12-05: qty 1

## 2013-12-05 MED FILL — Labetalol HCl IV Soln 5 MG/ML: INTRAVENOUS | Qty: 4 | Status: AC

## 2013-12-05 NOTE — Evaluation (Signed)
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   Clinical Impression   Pt presents with impaired balance, cognition, and aphasia.  He requires assistance for self care and mobility.  Impulsivity contributes further to pt's fall risk.  Will follow acutely.  Pt has good family support and has excellent rehab potential.    OT Assessment  Patient needs continued OT Services    Follow Up Recommendations  CIR    Barriers to Discharge      Equipment Recommendations       Recommendations for Other Services Rehab consult  Frequency  Min 3X/week    Precautions / Restrictions Precautions Precautions: Fall Precaution Comments: pt aphasic and impulsive Restrictions Weight Bearing Restrictions: No   Pertinent Vitals/Pain VSS, no pain    ADL  Eating/Feeding: Independent Where Assessed - Eating/Feeding: Bed level Grooming: Wash/dry hands;Wash/dry face;Supervision/safety Where Assessed - Grooming: Unsupported sitting Upper Body Bathing: Minimal assistance Where Assessed - Upper Body Bathing: Unsupported sitting Lower Body Bathing: Maximal assistance Where Assessed - Lower Body Bathing: Supported sit to stand Upper Body Dressing: Supervision/safety Where Assessed - Upper Body Dressing: Unsupported sitting Lower Body Dressing: Maximal assistance Where Assessed - Lower Body Dressing: Unsupported sitting;Supported sit to stand Toilet Transfer: Maximal assistance Toilet Transfer Method: Sit to stand Equipment Used: Gait belt Transfers/Ambulation Related to ADLs: max assistance for transfer, did not ambulate pt in absence of second person ADL Comments: Pt able to donn and doff his socks at EOB with supervision. Pt with poor standing  balance interfering with ability to manage pants or perform pericare in standing.    OT Diagnosis: Generalized weakness;Cognitive deficits  OT Problem List: Decreased activity tolerance;Impaired balance (sitting and/or standing);Decreased cognition;Decreased safety awareness;Decreased knowledge of use of DME or AE OT Treatment Interventions: Self-care/ADL training;DME and/or AE instruction;Cognitive remediation/compensation;Patient/family education;Balance training;Therapeutic activities   OT Goals(Current goals can be found in the care plan section) Acute Rehab OT Goals Patient Stated Goal: didn't state OT Goal Formulation: With patient/family Time For Goal Achievement: 12/19/13 Potential to Achieve Goals: Good ADL Goals Pt Will Perform Grooming: with min assist;standing Pt Will Perform Lower Body Bathing: with min assist;sit to/from stand Pt Will Perform Lower Body Dressing: with min assist;sit to/from stand Pt Will Transfer to Toilet: with min assist;ambulating Pt Will Perform Toileting - Clothing Manipulation and hygiene: with min assist;sit to/from stand Additional ADL Goal #1: Pt will maintain attention to task x 3 minutes with min verbal cues. Additional ADL Goal #2: Pt will follow two step commands within 5 seconds of request during ADL.  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[employer], Therapist, nutritional Communication: Expressive difficulties Dominant Hand: Right         Vision/Perception Vision - History Baseline Vision: Wears glasses only for reading Patient Visual Report: No change from baseline   Cognition  Cognition Arousal/Alertness: Awake/alert Behavior During Therapy: Impulsive Overall Cognitive Status: Impaired/Different from baseline Area of Impairment: Safety/judgement;Following commands;Problem solving;Attention Orientation Level: Disoriented to;Time Current Attention Level: Sustained Following Commands: Follows one step commands with increased time Safety/Judgement: Decreased awareness of safety;Decreased awareness of deficits Awareness: Intellectual Problem Solving: Difficulty sequencing;Requires verbal cues;Requires tactile cues    Extremity/Trunk Assessment Upper Extremity Assessment Upper Extremity Assessment: Overall WFL for tasks assessed Lower Extremity Assessment Lower Extremity Assessment: Overall WFL for tasks assessed Cervical / Trunk Assessment Cervical / Trunk Assessment: Normal     Mobility Bed Mobility Bed Mobility: Supine to Sit;Sit to Supine Supine to Sit: 5: Supervision;HOB elevated Sit to Supine: 5: Supervision;HOB elevated Details for Bed Mobility Assistance: v/c's for safety Transfers Transfers: Sit to Stand;Stand to Sit Sit to Stand: 3: Mod assist Stand to Sit: 3: Mod assist Details for Transfer Assistance: pt unsteady upon standing, pt impulsive     Exercise     Balance Balance Balance Assessed: Yes Static Sitting Balance Static Sitting - Balance Support: Feet supported;No upper extremity supported Static Sitting - Level of Assistance: 5: Stand by assistance Static Sitting -  Comment/# of Minutes: 3 Static Standing Balance Static Standing - Balance Support: Right upper extremity supported Static Standing - Level of Assistance: 2: Max assist Static Standing - Comment/# of Minutes: 30 sec   End of Session OT - End of Session Activity Tolerance: Patient tolerated treatment well Patient left: in bed;with call bell/phone within reach;with family/visitor present  GO     Evern Bio 12/05/2013, 12:54 PM [phone]

## 2013-12-05 NOTE — Consult Note (Signed)
Urology Consult  Requesting provider:  Dr. Ritta Slot.  CC: Urinary retention  HPI: 67 year old male presented to an outside hospital with a stroke. This was confirmed w/ imaging of his head showing a frontal stroke. He had been aphasic but has recovered some speech ability. He was voiding small amounts and indicating that he was having discomfort w/ voiding. His wife is at the bedside and history is obtained from her.  He had a meatotomy in the local urologist's office over 10 years ago. Negative history of surgery of the prostate, prostate cancer, or BPH meds. Negative personal history of smoking. Nurse attempted to straight cath and met resistance. No gross hematuria, but catheter did return with a bloody tip. Bladder scan was 699cc.   I was able to place an 18Fr coude tip cath w/ sterile technique; and this went in without any resistance. He drained >500cc of clear yellow urine. He did pull on his catheter which resulted in some terminal hematuria and some blood around the catheter.   PMH: Past Medical History  Diagnosis Date  . A-fib     PSH: History reviewed. No pertinent past surgical history.  Allergies: No Known Allergies  Medications: No prescriptions prior to admission     Social History: History   Social History  . Marital Status: Married    Spouse Name: N/A    Number of Children: N/A  . Years of Education: N/A   Occupational History  . Not on file.   Social History Main Topics  . Smoking status: Never Smoker   . Smokeless tobacco: Never Used  . Alcohol Use: Not on file  . Drug Use: Not on file  . Sexual Activity: Not on file   Other Topics Concern  . Not on file   Social History Narrative  . No narrative on file    Family History: History reviewed. No pertinent family history.  Review of Systems: Positive: Confusion, agitation. Negative: Fever. Difficult to obtain full ROS due to his mental status.  Physical Exam: Filed Vitals:   12/05/13 0300  BP: 160/113  Pulse: 124  Temp:   Resp: 19    General: No acute distress.  Awake. Agitated. Head:  Normocephalic.  Atraumatic. ENT:  No oral ulcers..  Mucous membranes moist Neck:  Supple.  No lymphadenopathy. CV:  No murmur. Mildly tachycardic. Pulmonary: Equal effort bilaterally.  Clear to auscultation bilaterally. Abdomen: Soft.  Non-ender to palpation. Skin:  Normal turgor.  No visible rash. Extremity: No gross deformity of bilateral upper extremities.  No gross deformity of    bilateral lower extremities. Neurologic: Alert. Appropriate mood.  Penis:  Crcumcised.  No lesions. Urethra: No Foley catheter in place.  Orthotopic meatus. Scrotum: No lesions.  No ecchymosis.  No erythema. Testicles: Descended bilaterally.  No masses bilaterally.   Studies:  Recent Labs     12/04/13  0526  HGB  15.5  WBC  7.1  PLT  161    Recent Labs     12/04/13  0526  NA  140  K  3.9  CL  107  CO2  23  BUN  10  CREATININE  0.78  CALCIUM  8.6  GFRNONAA  >90  GFRAA  >90     No results found for this basename: PT, INR, APTT,  in the last 72 hours   No components found with this basename: ABG,     Assessment:  Urinary retention. Plan: -No sign of stricture as cath was placed  w/ ease. Likely has BPH, but I was unable to perform a rectal exam due to agitation. -Catheter placed w/ ease, but subsequently removed due to joint concern from myself and Dr. Amada Jupiter that the patient would remove it traumatically. -He was voiding small amounts previously. I would allow him to void into a diaper. -Straight cath prn. -Please call me with any concerns in the future.   Pager: [phone]    CC: Dr. Ritta Slot.

## 2013-12-05 NOTE — Progress Notes (Signed)
OT Cancellation Note  Patient Details Name: Devon Lam MRN: [id number] DOB: 02-20-1946   Cancelled Treatment:    Reason Eval/Treat Not Completed: Other (comment);Medical issues which prohibited therapy Ativan due to agitation. Will return this pm if appropriate. [facility] Leticia Mcdiarmid, OTR/L  [phone] 12/05/2013 12/05/2013, 10:17 AM

## 2013-12-05 NOTE — Progress Notes (Signed)
Rehab Admissions Coordinator Note:  Patient was screened by Trish Mage for appropriateness for an Inpatient Acute Rehab Consult.  At this time, we are recommending Inpatient Rehab consult.  Trish Mage 12/05/2013, 12:49 PM  I can be reached at [phone].

## 2013-12-05 NOTE — Progress Notes (Signed)
Pt bladder scanned, showing 214 cc retained urine.  Pt voided using urinal 175 cc of bloody urine with clots.  Pt was I&O cath'd in ICU with coude catheter.  Will monitor urinary output.

## 2013-12-05 NOTE — Progress Notes (Signed)
UR completed.  Jevan Gaunt, RN BSN MHA CCM Trauma/Neuro ICU Case Manager [phone]  

## 2013-12-05 NOTE — Consult Note (Signed)
Physical Medicine and Rehabilitation Consult Reason for Consult: Left frontal lobe infarct  Referring Physician: Dr. Pearlean Brownie    HPI: Devon Lam is a 67 y.o. right-handed male with history of atrial fibrillation maintained on aspirin therapy. Patient independent working full time prior to admission. Admitted 12/04/2013 with aphasia and altered mental status. MRI of the brain showed acute infarct left frontal lobe with a small amount of hemorrhage. Echocardiogram with ejection fraction of 55% without emboli. CT angiogram head and neck unremarkable. Patient did receive TPA. Followup neurology services maintained on aspirin therapy and plan Xarelto as diet was advanced. Hospital course urinary retention with followup urology services with placement of a Foley catheter tube but discontinued with concerns that patient may remove it traumatically. Advise straight catheterizations as needed. Diet has been advanced to dysphagia 3 and thin liquids as of 12/05/2013. Physical and occupational therapy evaluations completed 12/05/2013 with recommendations of physical medicine rehabilitation consult to consider inpatient rehabilitation services   Review of Systems  Unable to perform ROS: language   Past Medical History  Diagnosis Date  . A-fib    History reviewed. No pertinent past surgical history. History reviewed. No pertinent family history. Social History:  reports that he has never smoked. He has never used smokeless tobacco. His alcohol and drug histories are not on file. Allergies: No Known Allergies Medications Prior to Admission  Medication Sig Dispense Refill  . aspirin 325 MG tablet Take 325 mg by mouth every evening.      . metoprolol (LOPRESSOR) 50 MG tablet Take 50 mg by mouth 2 (two) times daily.        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[employer], Chart[employer] loss adjuster Status:  Mobility: Bed Mobility Bed Mobility: Supine to Sit;Sit to Supine Supine to Sit: 5: Supervision;HOB elevated Sit to Supine: 5: Supervision;HOB elevated Transfers Transfers: Sit to Stand;Stand to Sit Sit to Stand: 3: Mod assist Stand to Sit: 3: Mod assist Ambulation/Gait Ambulation/Gait Assistance: 1: +2 Total assist (2nd person for lines and safety due to pt impulsivity) Ambulation/Gait: Patient Percentage: 70% Ambulation Distance (Feet): 100 Feet Assistive device: 1 person hand held assist Ambulation/Gait Assistance Details: pt HR increased to 160s, RN present. Pt extremely unsteady with lateral sway L>R. Pt requiring maxA to maintain balance. Pt unable to self correct. Gait Pattern: Step-through pattern;Decreased stride length;Wide base of support Gait velocity: impulsively fast requiring maxA to slow down General Gait Details: pt at significant falls risk Stairs: No    ADL: ADL Eating/Feeding: Independent Where Assessed - Eating/Feeding: Bed level Grooming: Wash/dry hands;Wash/dry face;Supervision/safety Where Assessed - Grooming: Unsupported sitting Upper Body Bathing: Minimal assistance Where Assessed - Upper Body Bathing: Unsupported sitting Lower Body Bathing: Maximal assistance Where Assessed - Lower Body Bathing: Supported sit to stand Upper Body Dressing: Supervision/safety Where Assessed - Upper Body Dressing: Unsupported sitting Lower Body Dressing: Maximal assistance Where Assessed - Lower Body Dressing: Unsupported  sitting;Supported sit to stand Toilet Transfer: Maximal assistance Toilet Transfer Method: Sit to stand Equipment Used: Gait belt Transfers/Ambulation Related to ADLs: max assistance for transfer, did  not ambulate pt in absence of second person ADL Comments: Pt able to donn and doff his socks at EOB with supervision. Pt with poor standing balance interfering with ability to manage pants or perform pericare in standing.  Cognition: Cognition Overall Cognitive Status: Impaired/Different from baseline Arousal/Alertness: Awake/alert Orientation Level: Oriented to person;Oriented to place;Disoriented to time;Disoriented to situation Attention: Focused Focused Attention: Impaired Focused Attention Impairment: Verbal basic;Verbal complex;Functional basic Memory: Appears intact Awareness: Impaired Awareness Impairment: Intellectual impairment Problem Solving: Impaired Problem Solving Impairment: Verbal basic;Functional basic Executive Function: Sequencing;Organizing;Initiating;Self Monitoring Reasoning: Impaired Reasoning Impairment: Verbal basic;Functional basic Sequencing: Impaired Sequencing Impairment: Verbal basic;Functional basic Organizing: Impaired Organizing Impairment: Verbal basic;Functional basic Initiating: Impaired Initiating Impairment: Verbal basic;Functional basic Self Monitoring: Impaired Self Monitoring Impairment: Verbal basic;Functional basic Safety/Judgment: Impaired Cognition Arousal/Alertness: Awake/alert Behavior During Therapy: Impulsive Overall Cognitive Status: Impaired/Different from baseline Area of Impairment: Safety/judgement;Following commands;Problem solving;Attention Orientation Level: Disoriented to;Time Current Attention Level: Sustained Following Commands: Follows one step commands with increased time Safety/Judgement: Decreased awareness of safety;Decreased awareness of deficits Awareness: Intellectual Problem Solving: Difficulty  sequencing;Requires verbal cues;Requires tactile cues  Blood pressure 162/107, pulse 120, temperature 98.6 F (37 C), temperature source Axillary, resp. rate 24, height 6\' 4"  (1.93 m), weight 106.4 kg (234 lb 9.1 oz), SpO2 96.00%. Physical Exam  Constitutional: He appears well-developed.  HENT:  Head: Normocephalic.  Dentition poor   Eyes:  Pupils round and reactive to light  Neck: Normal range of motion. Neck supple. No thyromegaly present.  Cardiovascular:  Cardiac rate controlled  Respiratory: Effort normal and breath sounds normal. No respiratory distress.  GI: Soft. Bowel sounds are normal. He exhibits no distension.  Neurological:  Alert and appropriate. Said hello. Followed most simple commands. Word finding deficits. Sometimes perseverated on a certain task and needed a lot of cues to re-direct. Mild right pronator drift. Delay with using right hand for fine motor tasks. Strength nearly symmetrical. Sensory exam grossly intact.   Skin: Skin is warm and dry.  Psychiatric: He has a normal mood and affect. His behavior is normal.    No results found for this or any previous visit (from the past 24 hour(s)). Ct Angio Head W/cm &/or Wo Cm  12/04/2013   CLINICAL DATA:  Stroke  EXAM: CT ANGIOGRAPHY HEAD AND NECK  TECHNIQUE: Multidetector CT imaging of the head and neck was performed using the standard protocol during bolus administration of intravenous contrast. Multiplanar CT image reconstructions including MIPs were obtained to evaluate the vascular anatomy. Carotid stenosis measurements (when applicable) are obtained utilizing NASCET criteria, using the distal internal carotid diameter as the denominator.  CONTRAST:  OMNIPAQUE IOHEXOL 350 MG/ML SOLN  COMPARISON:  Prior noncontrast head CT from earlier the same day.  FINDINGS: CTA HEAD FINDINGS  Precontrast images of the brain are stable in appearance without evidence of definite large vessel territory infarct. No acute  intracranial hemorrhage. No mass or midline shift. No hydrocephalus. Polypoid opacity present within the for of the left maxillary sinus.  The petrous, cavernous, and supra clinoid segments of the internal carotid arteries are well opacified and widely patent bilaterally. A1 segments, anterior communicating artery, and anterior cerebral arteries are widely patent. The M1 segments and middle cerebral artery branches bilaterally are well opacified. No intraluminal clot or proximal branch occlusion identified. Posterior communicating arteries are within normal limits. No intracranial aneurysm seen within the anterior circulation.  Intracranial portions of the distal vertebral arteries are somewhat tortuous without high-grade stenosis. Posterior inferior cerebellar arteries are well  opacified. The vertebrobasilar junction and basilar artery are normal. Posterior cerebral arteries, superior cerebellar arteries, and anterior inferior cerebral arteries are opacified without evidence of high-grade stenosis or other abnormality. No intracranial aneurysm seen within the posterior circulation.  Review of the MIP images confirms the above findings.  CTA NECK FINDINGS  Visualized aortic arch demonstrates a normal appearance with normal 3 vessel morphology. No high-grade stenosis seen at the origin of the great vessels. The subclavian arteries are widely patent.  Tortuosity of the proximal left common carotid artery is noted. Common carotid arteries are symmetric in caliber without high-grade stenosis or dissection. Carotid bifurcations are normal.  The internal carotid arteries are symmetric in caliber without evidence of high-grade stenosis, dissection, or other abnormality.  The external carotid arteries and their branch vessels are well opacified.  The vertebral arteries are symmetric in size and caliber without high-grade stenosis or dissection  Review of the MIP images confirms the above findings.  Visualized soft tissues  of the neck are within normal limits. No mass lesion, adenopathy, or loculated fluid collection. Visualized superior mediastinum is unremarkable. Lungs apices are clear. No osseous abnormality.  IMPRESSION: 1. Normal CTA of the brain without evidence of proximal arterial occlusion, intraluminal clot, high-grade flow-limiting stenosis, or intracranial aneurysm.  2. Unremarkable CT of the neck without evidence of high-grade stenosis or dissection.   Electronically Signed   By: Rise Mu M.D.   On: 12/04/2013 01:27   Ct Head Wo Contrast  12/05/2013   CLINICAL DATA:  Stroke.  Twenty-four hr followup after TPA.  EXAM: CT HEAD WITHOUT CONTRAST  TECHNIQUE: Contiguous axial images were obtained from the base of the skull through the vertex without intravenous contrast.  COMPARISON:  Head CT from the same date at 0033  FINDINGS: No acute osseous or soft tissue changes have developed. There is chronic lobulated mucosal thickening in the inferior left maxillary antrum, likely mucous retention cysts.  Expected evolution of acute infarct in the left frontal lobe, with interval loss of gray-white differentiation. No hemorrhagic conversion. No evidence of infarct increase since brain MRI earlier the same day. No evidence of new site of ischemia.  No hydrocephalus or shift.  No evidence of mass lesion.  Small, chronic posterior fossa hygroma, displacing the left more than right cerebellar hemispheres.  IMPRESSION: Acute left frontal infarct. No hemorrhagic conversion or evidence of infarct extension.   Electronically Signed   By: Tiburcio Pea M.D.   On: 12/05/2013 00:50   Ct Angio Neck W/cm &/or Wo/cm  12/04/2013   CLINICAL DATA:  Stroke  EXAM: CT ANGIOGRAPHY HEAD AND NECK  TECHNIQUE: Multidetector CT imaging of the head and neck was performed using the standard protocol during bolus administration of intravenous contrast. Multiplanar CT image reconstructions including MIPs were obtained to evaluate the  vascular anatomy. Carotid stenosis measurements (when applicable) are obtained utilizing NASCET criteria, using the distal internal carotid diameter as the denominator.  CONTRAST:  OMNIPAQUE IOHEXOL 350 MG/ML SOLN  COMPARISON:  Prior noncontrast head CT from earlier the same day.  FINDINGS: CTA HEAD FINDINGS  Precontrast images of the brain are stable in appearance without evidence of definite large vessel territory infarct. No acute intracranial hemorrhage. No mass or midline shift. No hydrocephalus. Polypoid opacity present within the for of the left maxillary sinus.  The petrous, cavernous, and supra clinoid segments of the internal carotid arteries are well opacified and widely patent bilaterally. A1 segments, anterior communicating artery, and anterior cerebral arteries are widely  patent. The M1 segments and middle cerebral artery branches bilaterally are well opacified. No intraluminal clot or proximal branch occlusion identified. Posterior communicating arteries are within normal limits. No intracranial aneurysm seen within the anterior circulation.  Intracranial portions of the distal vertebral arteries are somewhat tortuous without high-grade stenosis. Posterior inferior cerebellar arteries are well opacified. The vertebrobasilar junction and basilar artery are normal. Posterior cerebral arteries, superior cerebellar arteries, and anterior inferior cerebral arteries are opacified without evidence of high-grade stenosis or other abnormality. No intracranial aneurysm seen within the posterior circulation.  Review of the MIP images confirms the above findings.  CTA NECK FINDINGS  Visualized aortic arch demonstrates a normal appearance with normal 3 vessel morphology. No high-grade stenosis seen at the origin of the great vessels. The subclavian arteries are widely patent.  Tortuosity of the proximal left common carotid artery is noted. Common carotid arteries are symmetric in caliber without high-grade  stenosis or dissection. Carotid bifurcations are normal.  The internal carotid arteries are symmetric in caliber without evidence of high-grade stenosis, dissection, or other abnormality.  The external carotid arteries and their branch vessels are well opacified.  The vertebral arteries are symmetric in size and caliber without high-grade stenosis or dissection  Review of the MIP images confirms the above findings.  Visualized soft tissues of the neck are within normal limits. No mass lesion, adenopathy, or loculated fluid collection. Visualized superior mediastinum is unremarkable. Lungs apices are clear. No osseous abnormality.  IMPRESSION: 1. Normal CTA of the brain without evidence of proximal arterial occlusion, intraluminal clot, high-grade flow-limiting stenosis, or intracranial aneurysm.  2. Unremarkable CT of the neck without evidence of high-grade stenosis or dissection.   Electronically Signed   By: Rise Mu M.D.   On: 12/04/2013 01:27   Mr Brain Wo Contrast  12/04/2013   CLINICAL DATA:  Stroke.  TPA given 24 hr ago.  EXAM: MRI HEAD WITHOUT CONTRAST  TECHNIQUE: Multiplanar, multiecho pulse sequences of the brain and surrounding structures were obtained without intravenous contrast.  COMPARISON:  CT 12/04/2013  FINDINGS: Acute infarct in the left frontal lobe involving cortex extending down to the operculum. Small amount of associated hemorrhage in the infarct.  No other areas of acute infarct.  Mild atrophy. Ventricle size is normal. No midline shift. No significant chronic ischemic changes are present.  IMPRESSION: Acute infarct left frontal lobe with a small amount of hemorrhage. This involves the operculum and left frontal lobe.   Electronically Signed   By: Marlan Palau M.D.   On: 12/04/2013 17:07   Dg Chest Port 1 View  12/04/2013   CLINICAL DATA:  History of CVA  EXAM: PORTABLE CHEST - 1 VIEW  COMPARISON:  12/24/2009  FINDINGS: Low lung volumes. Cardiac silhouette enlarged.  There is prominence of interstitial markings significant peribronchial cuffing. No focal regions of consolidation or focal infiltrates. The osseous structures are unremarkable.  IMPRESSION: Mild prominence of the interstitial markings accentuated by the patient's low lung volumes. Component of pulmonary vascular congestion is of diagnostic consideration. No focal region of consolidation or focal infiltrates.   Electronically Signed   By: Salome Holmes M.D.   On: 12/04/2013 08:08    Assessment/Plan: Diagnosis: left frontal infarct 1. Does the need for close, 24 hr/day medical supervision in concert with the patient's rehab needs make it unreasonable for this patient to be served in a less intensive setting? Potentially 2. Co-Morbidities requiring supervision/potential complications: afib, htn 3. Due to bladder management, bowel management, safety, skin/wound  care, disease management, medication administration, pain management and patient education, does the patient require 24 hr/day rehab nursing? Potentially 4. Does the patient require coordinated care of a physician, rehab nurse, PT (1-2 hrs/day, 5 days/week), OT (1-2 hrs/day, 5 days/week) and SLP (1-2 hrs/day, 5 days/week) to address physical and functional deficits in the context of the above medical diagnosis(es)? Yes and Potentially Addressing deficits in the following areas: balance, endurance, locomotion, strength, transferring, bowel/bladder control, bathing, dressing, feeding, grooming, toileting, language and psychosocial support 5. Can the patient actively participate in an intensive therapy program of at least 3 hrs of therapy per day at least 5 days per week? Yes 6. The potential for patient to make measurable gains while on inpatient rehab is good and fair 7. Anticipated functional outcomes upon discharge from inpatient rehab are mod I with PT, mod I with OT, supervision to min assist with SLP. 8. Estimated rehab length of stay to reach  the above functional goals is: week or less 9. Does the patient have adequate social supports to accommodate these discharge functional goals? Yes 10. Anticipated D/C setting: Home 11. Anticipated post D/C treatments: Outpt therapy 12. Overall Rehab/Functional Prognosis: excellent  RECOMMENDATIONS: This patient's condition is appropriate for continued rehabilitative care in the following setting: CIR vs outpt therapies Patient has agreed to participate in recommended program. Yes Note that insurance prior authorization may be required for reimbursement for recommended care.  Comment: Pt seems to be making swift progress. Was up to the bathroom several times today with wife and did well per wife. May only need outpt services given history and today's exam. I would like for therapy to get him up again today so that we can see how he does. Rehab RN to follow up.   Ranelle Oyster, MD, [place] [facility]     12/05/2013

## 2013-12-05 NOTE — Progress Notes (Signed)
PT Cancellation Note  Patient Details Name: Devon Lam MRN: [id number] DOB: 10/01/1946   Cancelled Treatment:    Reason Eval/Treat Not Completed: Patient not medically ready. RN reported pt to have been agitated over night and was given ativan and now currently extremely lethargic with limited ability to participate in PT at this time. PT to return when able.   Marcene Brawn 12/05/2013, 7:56 AM

## 2013-12-05 NOTE — Progress Notes (Signed)
Speech Language Pathology Treatment: Dysphagia;Cognitive-Linquistic  Patient Details Name: Devon Lam MRN: [id number] DOB: [birth year] Today's Date: 12/05/2013 Time: 4098-1191 SLP Time Calculation (min): 30 min  Assessment / Plan / Recommendation Clinical Impression  Pt demonstrates improved oral awareness of POs today, still lacking some automaticity with oral transit of purees, sometimes chewing applesauce and taking second and third bites before swallowing. Improved with verbal cues. With cup and straws sips of water pt was automatic and timely, no evidence of aspiration. Pt also masticated cracker without difficulty. Given absence of significant oral weakness, pt is appropriate to upgrade to dys 3/thin liquids with supervision. Will f/u tomorrow for tolerance.   Pts awareness of verbal deficits improving, pt using gestures to supplement paraphasic speech output. Pt is communicating basic wants and needs with mod assist. Comprehension good with single words and phrases, quickly deteriorates with any complexity of language. Moderate aphasia. Recommend CIR.    HPI HPI: Devon Lam is a 67 y.o. male with a history of atrial fibrillation who was in his normal state of health at 7 pm when his family talked to him, then when his wife arrrived home at 7:30pm found him to be confused. He was brought to [facility] ED where he was seen to be aphasic and therefore tPA was administered. He was given tPA at 22:32.     Pertinent Vitals NA  SLP Plan  Continue with current plan of care    Recommendations Diet recommendations: Dysphagia 3 (mechanical soft);Thin liquid Liquids provided via: Cup;Straw Medication Administration: Whole meds with liquid Supervision: Patient able to self feed Compensations: Slow rate;Small sips/bites Postural Changes and/or Swallow Maneuvers: Seated upright 90 degrees              Oral Care Recommendations: Oral care BID Follow up Recommendations: Inpatient  Rehab Plan: Continue with current plan of care    GO    [facility], MA CCC-SLP [phone]  Claudine Mouton 12/05/2013, 11:40 AM

## 2013-12-05 NOTE — Progress Notes (Signed)
Stroke Team Progress Note  HISTORY Devon Lam is a 67 y.o. male with a history of atrial fibrillation who was in his normal state of health at 7 pm 12/03/2013 when his family talked to him, then when his wife arrrived home at 7:30pm found him to be confused. He was brought to [facility] ED where he was seen to be aphasic and therefore tPA was administered. He was given tPA at 12/03/2013  22:32. He was transferred here following this. On arrival, Dr. Amada Jupiter accompanied the patient to CT angiogram which did not reveal any clear intervenable lesions and therefore no further acute therapy was pursued.  He was admitted to the neuro ICU for further evaluation and treatment.  SUBJECTIVE His wife is at the bedside.  Overall he feels his condition is gradually improving. Pt plans to retire the first of the year.  OBJECTIVE Most recent Vital Signs: Filed Vitals:   12/05/13 0630 12/05/13 0700 12/05/13 0800 12/05/13 0900  BP: 135/86 116/80 145/98   Pulse: 95 100 99 96  Temp:  98.1 F (36.7 C)    TempSrc:  Axillary    Resp: 18 17 18 20   Height:      Weight:      SpO2: 97% 96% 96% 96%   CBG (last 3)  No results found for this basename: GLUCAP,  in the last 72 hours  IV Fluid Intake:   . sodium chloride 1,000 mL (12/04/13 2114)    MEDICATIONS  . LORazepam      . pantoprazole (PROTONIX) IV  40 mg Intravenous QHS  . pneumococcal 23 valent vaccine  0.5 mL Intramuscular Tomorrow-1000   PRN:  acetaminophen, acetaminophen, labetalol, metoprolol  Diet:  NPO  Activity:  Bedrest DVT Prophylaxis:  SCDs   CLINICALLY SIGNIFICANT STUDIES Basic Metabolic Panel:   Recent Labs Lab 12/04/13 0526  NA 140  K 3.9  CL 107  CO2 23  GLUCOSE 122*  BUN 10  CREATININE 0.78  CALCIUM 8.6   Liver Function Tests: No results found for this basename: AST, ALT, ALKPHOS, BILITOT, PROT, ALBUMIN,  in the last 168 hours CBC:   Recent Labs Lab 12/04/13 0526  WBC 7.1  NEUTROABS 5.6  HGB 15.5  HCT  42.8  MCV 86.3  PLT 161   Coagulation: No results found for this basename: LABPROT, INR,  in the last 168 hours Cardiac Enzymes: No results found for this basename: CKTOTAL, CKMB, CKMBINDEX, TROPONINI,  in the last 168 hours Urinalysis: No results found for this basename: COLORURINE, APPERANCEUR, LABSPEC, PHURINE, GLUCOSEU, HGBUR, BILIRUBINUR, KETONESUR, PROTEINUR, UROBILINOGEN, NITRITE, LEUKOCYTESUR,  in the last 168 hours Lipid Panel    Component Value Date/Time   CHOL 160 12/04/2013 0536   TRIG 106 12/04/2013 0536   HDL 35* 12/04/2013 0536   CHOLHDL 4.6 12/04/2013 0536   VLDL 21 12/04/2013 0536   LDLCALC 104* 12/04/2013 0536   HgbA1C  Lab Results  Component Value Date   HGBA1C 6.2* 12/04/2013    Urine Drug Screen:   No results found for this basename: labopia,  cocainscrnur,  labbenz,  amphetmu,  thcu,  labbarb    Alcohol Level: No results found for this basename: ETH,  in the last 168 hours     CT of the brain  12/05/2013    Acute left frontal infarct. No hemorrhagic conversion or evidence of infarct extension.   CT angio head and neck 12/04/2013   : 1. Normal CTA of the brain without evidence of proximal arterial occlusion, intraluminal  clot, high-grade flow-limiting stenosis, or intracranial aneurysm.  2. Unremarkable CT of the neck without evidence of high-grade stenosis or dissection.     MRI of the brain  12/04/2013    Acute infarct left frontal lobe with a small amount of hemorrhage. This involves the operculum and left frontal lobe.     MRA of the brain  See CT angio  2D Echocardiogram  EF 50-55% with no source of embolus.   Carotid Doppler  See CT angio  CXR  12/04/2013   Mild prominence of the interstitial markings accentuated by the patient's low lung volumes. Component of pulmonary vascular congestion is of diagnostic consideration. No focal region of consolidation or focal infiltrates.     EKG  .   Therapy Recommendations    GENERAL EXAM:  Patient is  in no distress; well developed, nourished and groomed  CARDIOVASCULAR:  Regular rate and rhythm, no murmurs, no carotid bruits  NEUROLOGIC:  MENTAL STATUS: awake, alert, SIGNIFICANT EXPRESSIVE APHASIA. ABLE TO NAME PEN. CANNOT TELL ME HIS NAME, BUT ABLE TO REPEAT HIS NAME. COMPREHENSION INTACT TO SIMPLE COMMANDS, BUT CANNOT TOUCH HIS OWN NOSE TO COMMAND.  CRANIAL NERVE: pupils equal and reactive to light, visual fields full to confrontation, extraocular muscles intact, no nystagmus, facial sensation and strength symmetric, hearing intact, palate elevates symmetrically, uvula midline, shoulder shrug symmetric, tongue midline.  MOTOR: normal bulk and tone, full strength in the BUE, BLE  SENSORY: normal and symmetric to light touch  COORDINATION: DIFFICULTY FOLLOWING COMMANDS TO TEST COORDINATION  REFLEXES: deep tendon reflexes present and symmetric  GAIT/STATION: IN BED, SITTING UP  ASSESSMENT [name] is a 67 y.o. male presenting with aphasia. Status post IV t-PA 12/03/2013 at 2232. 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 Infarct felt to be embolic secondary to known atrial fibrillation.  On aspirin 325 mg orally every day prior to admission. Now on no antithrombotics due to post tpa hematuria and hemorrhage on MRI for secondary stroke prevention. Patient with resultant expressive aphasia, mild right hemiparesis. Work up underway.  Hematuria post tPA administration, now resolved atrial fibrillation, not on anticoagulation prior to admission due to low CHAD2 risk score Atrial Fibrillation  CHA2DS2-VASc Score for Atrial Fibrillation Stroke Risk = 4   Age in Years:  28-74   +1   Sex:  Male   0    Congestive Heart Failure History:  no    Hypertension History:  yes   +1    Stroke/TIA/Thromboembolism History:  yes  +2   Vascular Disease History:  no    Diabetes Mellitus:  noHypertension   Family hx stroke  (mother)  Hospital day # 1  TREATMENT/PLAN   transfer to the floor  Start aspirin suppository now as pt unable to swallow. Plan xarelto once pt able to swallow for secondary stroke prevention due to afib  OOB, therapy evals  ST check swallow. Diet per ST.  Annie Main, MSN, RN, ANVP-BC, ANP-BC, GNP-BC [facility] Pager: [phone] 12/05/2013 9:13 AM  I have personally obtained a history, examined the patient, evaluated imaging results, and formulated the assessment and plan of care. I agree with the above. Delia Heady, MD

## 2013-12-05 NOTE — Evaluation (Signed)
Physical Therapy Evaluation Patient Details Name: Devon Lam MRN: [id number] DOB: 1946-08-19 Today's Date: 12/05/2013 Time: 4098-1191 PT Time Calculation (min): 20 min  PT Assessment / Plan / Recommendation History of Present Illness  Pt admitted for aphasia and found to have a L frontal infarct.  Clinical Impression  Pt aphasic with impaired balance and increased falls risk. Pt independent PTA and demo's excellent rehab potential. Pt excellent candidate for CIR upon d/c for maximum functional recovery for safe transition home.    PT Assessment  Patient needs continued PT services    Follow Up Recommendations  CIR    Does the patient have the potential to tolerate intense rehabilitation      Barriers to Discharge        Equipment Recommendations   (TBD)    Recommendations for Other Services Rehab consult   Frequency Min 4X/week    Precautions / Restrictions Precautions Precautions: Fall Precaution Comments: pt aphasic and impulsive Restrictions Weight Bearing Restrictions: No   Pertinent Vitals/Pain Unable to report, appears to be no in pain      Mobility  Bed Mobility Bed Mobility: Supine to Sit Supine to Sit: 4: Min assist;HOB flat Details for Bed Mobility Assistance: v/c's for safety Transfers Transfers: Sit to Stand;Stand to Sit Sit to Stand: 3: Mod assist;With upper extremity assist;From bed Stand to Sit: 3: Mod assist;With upper extremity assist;To chair/3-in-1 Details for Transfer Assistance: pt unsteady upon standing, pt impulsive Ambulation/Gait Ambulation/Gait Assistance: 1: +2 Total assist (2nd person for lines and safety due to pt impulsivity) Ambulation/Gait: Patient Percentage: 70% Ambulation Distance (Feet): 100 Feet Assistive device: 1 person hand held assist Ambulation/Gait Assistance Details: pt HR increased to 160s, RN present. Pt extremely unsteady with lateral sway L>R. Pt requiring maxA to maintain balance. Pt unable to self  correct. Gait Pattern: Step-through pattern;Decreased stride length;Wide base of support Gait velocity: impulsively fast requiring maxA to slow down General Gait Details: pt at significant falls risk Stairs: No Modified Rankin (Stroke Patients Only) Pre-Morbid Rankin Score: No symptoms Modified Rankin: Moderately severe disability    Exercises     PT Diagnosis: Difficulty walking;Generalized weakness  PT Problem List: Decreased activity tolerance;Decreased balance;Decreased mobility;Decreased coordination;Decreased cognition;Decreased safety awareness PT Treatment Interventions: DME instruction;Gait training;Stair training;Functional mobility training;Therapeutic activities;Therapeutic exercise     PT Goals(Current goals can be found in the care plan section) Acute Rehab PT Goals Patient Stated Goal: didn't state PT Goal Formulation: With patient Time For Goal Achievement: 12/19/13 Potential to Achieve Goals: Good  Visit Information  Last PT Received On: 12/05/13 Assistance Needed: +2 (for safety due to pt impulsivity) Reason Eval/Treat Not Completed: Patient not medically ready History of Present Illness: Pt admitted for aphasia and found to have a L frontal infarct.       Prior Functioning  Home Living Family/patient expects to be discharged to:: Inpatient rehab Living Arrangements: Spouse/significant other Available Help at Discharge: Available PRN/intermittently Type of Home: House Additional Comments: unable to obtain history due to aphasia. pt reports wife and him both work and he has 2 sons. Unable to obtain home set up. attempted to have pt write however inconsistent with accuracy. ie. wrote "11" for the month. Prior Function Level of Independence: Independent Communication Communication: Expressive difficulties Dominant Hand: Right    Cognition  Cognition Arousal/Alertness: Awake/alert Behavior During Therapy: Impulsive;Restless Overall Cognitive Status:  Impaired/Different from baseline Area of Impairment: Orientation;Attention;Memory;Awareness;Safety/judgement;Following commands;Problem solving Orientation Level: Disoriented to;Time Current Attention Level: Sustained Following Commands: Follows one step commands with increased  time Safety/Judgement: Decreased awareness of safety;Decreased awareness of deficits Awareness: Intellectual Problem Solving: Difficulty sequencing;Requires verbal cues;Requires tactile cues    Extremity/Trunk Assessment Upper Extremity Assessment Upper Extremity Assessment: Overall WFL for tasks assessed Lower Extremity Assessment Lower Extremity Assessment: Overall WFL for tasks assessed Cervical / Trunk Assessment Cervical / Trunk Assessment: Normal   Balance Balance Balance Assessed: Yes Static Sitting Balance Static Sitting - Balance Support: Feet supported;No upper extremity supported Static Sitting - Level of Assistance: 5: Stand by assistance Static Sitting - Comment/# of Minutes: 3 min Static Standing Balance Static Standing - Balance Support: Right upper extremity supported Static Standing - Level of Assistance: 2: Max assist Static Standing - Comment/# of Minutes: 30 sec  End of Session PT - End of Session Equipment Utilized During Treatment: Gait belt Activity Tolerance: Patient tolerated treatment well Patient left: in chair (with RN Staff, being transferred to 4N) Nurse Communication: Mobility status  GP     Marcene Brawn 12/05/2013, 10:35 AM  Lewis Shock, PT, DPT Pager #: [phone] Office #: [phone]

## 2013-12-06 MED ORDER — RIVAROXABAN 20 MG PO TABS
20.0000 mg | ORAL_TABLET | Freq: Every day | ORAL | Status: DC
  Filled 2013-12-06: qty 1

## 2013-12-06 MED ORDER — ASPIRIN 325 MG PO TABS: 325.0000 mg | ORAL_TABLET | Freq: Every day | ORAL | Status: DC

## 2013-12-06 MED ORDER — RIVAROXABAN 20 MG PO TABS: 20.0000 mg | ORAL_TABLET | Freq: Every day | ORAL | Status: DC

## 2013-12-06 NOTE — Progress Notes (Signed)
Physical Therapy Treatment Patient Details Name: Atreyu Mak MRN: [id number] DOB: [birth year] Today's Date: 12/06/2013 Time: 4098-1191 PT Time Calculation (min): 19 min  PT Assessment / Plan / Recommendation  History of Present Illness Pt admitted for aphasia and found to have a L frontal infarct. Patient progressing very well this session. Spoke with MD who stated they will pursue OPPT for therapy needs. Updated POC accordingly. Patient has meet acute goals and is planning to DC home today  PT Comments     Follow Up Recommendations  Outpatient PT     Does the patient have the potential to tolerate intense rehabilitation     Barriers to Discharge        Equipment Recommendations       Recommendations for Other Services    Frequency Min 4X/week   Progress towards PT Goals Progress towards PT goals: Progressing toward goals  Plan Discharge plan needs to be updated    Precautions / Restrictions Precautions Precautions: Fall Precaution Comments: less impulsive today Restrictions Weight Bearing Restrictions: No   Pertinent Vitals/Pain nad     Mobility  Bed Mobility Supine to Sit: 6: Modified independent (Device/Increase time) Sit to Supine: 6: Modified independent (Device/Increase time) Transfers Sit to Stand: 4: Min guard;From bed Stand to Sit: 4: Min guard;To chair/3-in-1 Details for Transfer Assistance: Minguard for safety Ambulation/Gait Ambulation/Gait Assistance: 4: Min guard Ambulation Distance (Feet): 600 Feet Assistive device: Rolling walker;None Ambulation/Gait Assistance Details: 200 with RW, 400 without RW. Patient generally steady with no LOB. See DGI Gait Pattern: Step-through pattern;Decreased stride length Stairs: Yes Stairs Assistance: 4: Min guard Stair Management Technique: Two rails Number of Stairs: 12    Exercises     PT Diagnosis:    PT Problem List:   PT Treatment Interventions:     PT Goals (current goals can now be found in the  care plan section)    Visit Information  Last PT Received On: 12/06/13 Assistance Needed: +1 History of Present Illness: Pt admitted for aphasia and found to have a L frontal infarct.    Subjective Data      Cognition  Cognition Arousal/Alertness: Awake/alert Behavior During Therapy: WFL for tasks assessed/performed Overall Cognitive Status: Within Functional Limits for tasks assessed Following Commands: Follows one step commands consistently;Follows multi-step commands inconsistently    Balance  Standardized Balance Assessment Standardized Balance Assessment: Dynamic Gait Index Dynamic Gait Index Level Surface: Normal Change in Gait Speed: Normal Gait with Horizontal Head Turns: Normal Gait with Vertical Head Turns: Normal Gait and Pivot Turn: Mild Impairment Step Over Obstacle: Normal Step Around Obstacles: Normal Steps: Mild Impairment Total Score: 22  End of Session PT - End of Session Activity Tolerance: Patient tolerated treatment well Patient left: in chair;with call bell/phone within reach;with family/visitor present Nurse Communication: Mobility status   GP     Fredrich Birks 12/06/2013, 11:27 AM  12/06/2013 Fredrich Birks PTA [phone] pager [phone] office

## 2013-12-06 NOTE — Progress Notes (Signed)
Talked to patient about DCP; Patient is agreeable to go to Outpatient therapy at [facility] rehab; clinical information/ orders faxed [phone]); Coupon given to the patient for 30day xarelto- patient is to present coupon along with the prescription at his pharmacy of choice to receive his free trial of medication; Alexis Goodell [phone]

## 2013-12-06 NOTE — Progress Notes (Signed)
Stroke Team Progress Note  HISTORY Devon Lam is a 67 y.o. male with a history of atrial fibrillation who was in his normal state of health at 7 pm 12/03/2013 when his family talked to him, then when his wife arrrived home at 7:30pm found him to be confused. He was brought to [facility] ED where he was seen to be aphasic and therefore tPA was administered. He was given tPA at 12/03/2013  22:32. He was transferred here following this. On arrival, Dr. Amada Jupiter accompanied the patient to CT angiogram which did not reveal any clear intervenable lesions and therefore no further acute therapy was pursued.  He was admitted to the neuro ICU for further evaluation and treatment.  SUBJECTIVE His wife is at the bedside.    OBJECTIVE Most recent Vital Signs: Filed Vitals:   12/05/13 2035 12/06/13 0200 12/06/13 0536 12/06/13 0955  BP: 139/91 144/93 161/99 141/98  Pulse: 96 94 102 94  Temp: 99.3 F (37.4 C) 97.6 F (36.4 C) 98.4 F (36.9 C) 97.7 F (36.5 C)  TempSrc: Oral Oral Oral Oral  Resp: 20 18 18 20   Height:      Weight:      SpO2: 100% 96% 97% 98%   CBG (last 3)  No results found for this basename: GLUCAP,  in the last 72 hours  IV Fluid Intake:   . sodium chloride 75 mL/hr at 12/06/13 1610    MEDICATIONS  . aspirin  300 mg Rectal Daily  . metoprolol  50 mg Oral BID   PRN:  acetaminophen, acetaminophen, labetalol, metoprolol  Diet:  Dysphagia 3 thin liquids Activity: OOB DVT Prophylaxis:  SCDs   CLINICALLY SIGNIFICANT STUDIES Basic Metabolic Panel:   Recent Labs Lab 12/04/13 0526  NA 140  K 3.9  CL 107  CO2 23  GLUCOSE 122*  BUN 10  CREATININE 0.78  CALCIUM 8.6   Liver Function Tests: No results found for this basename: AST, ALT, ALKPHOS, BILITOT, PROT, ALBUMIN,  in the last 168 hours CBC:   Recent Labs Lab 12/04/13 0526  WBC 7.1  NEUTROABS 5.6  HGB 15.5  HCT 42.8  MCV 86.3  PLT 161   Coagulation: No results found for this basename: LABPROT, INR,  in  the last 168 hours Cardiac Enzymes: No results found for this basename: CKTOTAL, CKMB, CKMBINDEX, TROPONINI,  in the last 168 hours Urinalysis: No results found for this basename: COLORURINE, APPERANCEUR, LABSPEC, PHURINE, GLUCOSEU, HGBUR, BILIRUBINUR, KETONESUR, PROTEINUR, UROBILINOGEN, NITRITE, LEUKOCYTESUR,  in the last 168 hours Lipid Panel    Component Value Date/Time   CHOL 160 12/04/2013 0536   TRIG 106 12/04/2013 0536   HDL 35* 12/04/2013 0536   CHOLHDL 4.6 12/04/2013 0536   VLDL 21 12/04/2013 0536   LDLCALC 104* 12/04/2013 0536   HgbA1C  Lab Results  Component Value Date   HGBA1C 6.2* 12/04/2013    Urine Drug Screen:   No results found for this basename: labopia,  cocainscrnur,  labbenz,  amphetmu,  thcu,  labbarb    Alcohol Level: No results found for this basename: ETH,  in the last 168 hours     CT of the brain  12/05/2013    Acute left frontal infarct. No hemorrhagic conversion or evidence of infarct extension.   CT angio head and neck 12/04/2013   : 1. Normal CTA of the brain without evidence of proximal arterial occlusion, intraluminal clot, high-grade flow-limiting stenosis, or intracranial aneurysm.  2. Unremarkable CT of the neck without evidence of  high-grade stenosis or dissection.     MRI of the brain  12/04/2013    Acute infarct left frontal lobe with a small amount of hemorrhage. This involves the operculum and left frontal lobe.     MRA of the brain  See CT angio  2D Echocardiogram  EF 50-55% with no source of embolus.   Carotid Doppler  See CT angio  CXR  12/04/2013   Mild prominence of the interstitial markings accentuated by the patient's low lung volumes. Component of pulmonary vascular congestion is of diagnostic consideration. No focal region of consolidation or focal infiltrates.     EKG  .   Therapy Recommendations CIR   GENERAL EXAM:  Patient is in no distress; well developed, nourished and groomed  CARDIOVASCULAR:  Regular rate and  rhythm, no murmurs, no carotid bruits  NEUROLOGIC:  MENTAL STATUS: awake, alert, SIGNIFICANT EXPRESSIVE APHASIA. ABLE TO NAME PEN. CANNOT TELL ME HIS NAME, BUT ABLE TO REPEAT HIS NAME. COMPREHENSION INTACT TO SIMPLE COMMANDS, BUT CANNOT TOUCH HIS OWN NOSE TO COMMAND.  CRANIAL NERVE: pupils equal and reactive to light, visual fields full to confrontation, extraocular muscles intact, no nystagmus, facial sensation and strength symmetric, hearing intact, palate elevates symmetrically, uvula midline, shoulder shrug symmetric, tongue midline.  MOTOR: normal bulk and tone, full strength in the BUE, BLE  SENSORY: normal and symmetric to light touch  COORDINATION: DIFFICULTY FOLLOWING COMMANDS TO TEST COORDINATION  REFLEXES: deep tendon reflexes present and symmetric  GAIT/STATION: IN BED, SITTING UP  ASSESSMENT [name] is a 67 y.o. male presenting with aphasia. Status post IV t-PA 12/03/2013 at 2232. 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 Infarct felt to be embolic secondary to known atrial fibrillation.  On aspirin 325 mg orally every day prior to admission. Now on no antithrombotics due to post tpa hematuria and hemorrhage on MRI for secondary stroke prevention. Patient with resultant expressive aphasia, mild right hemiparesis. Work up underway.  Hematuria post tPA administration, now resolved atrial fibrillation, not on anticoagulation prior to admission due to low CHAD2 risk score Atrial Fibrillation  CHA2DS2-VASc Score for Atrial Fibrillation Stroke Risk = 4   Age in Years:  95-74   +1   Sex:  Male   0    Congestive Heart Failure History:  no    Hypertension History:  yes   +1    Stroke/TIA/Thromboembolism History:  yes  +2   Vascular Disease History:  no    Diabetes Mellitus:  noHypertension   Family hx stroke (mother)  Hospital day # 2  TREATMENT/PLAN  Continue aspirin for now. 1 week after stroke, change to  xarelto for secondary stroke prevention given atrial fibrillation dx. Stop aspirin at that time.  Plan home with wife, OP therapies at this point. Will await therapy input today.  Do not work or drive at this time.  followup with Dr. Pearlean Brownie in 2 months.  Annie Main, MSN, RN, ANVP-BC, ANP-BC, [facility] Pager: [phone] 12/06/2013 10:30 AM  I have personally obtained a history, examined the patient, evaluated imaging results, and formulated the assessment and plan of care. I agree with the above.  Delia Heady, MD

## 2013-12-06 NOTE — Progress Notes (Signed)
Occupational Therapy Treatment Patient Details Name: Devon Lam MRN: [id number] DOB: 10-20-1946 Today's Date: 12/06/2013 Time: 4098-1191 OT Time Calculation (min): 26 min  OT Assessment / Plan / Recommendation  History of present illness Pt admitted for aphasia and found to have a L frontal infarct.   OT comments  Pt progressing towards goals. Education provided to both pt and spouse.   Follow Up Recommendations  Outpatient OT;Supervision/Assistance - 24 hour    Barriers to Discharge       Equipment Recommendations  None recommended by OT (family going to work out Environmental consultant)    Recommendations for Other Services    Frequency Min 3X/week   Progress towards OT Goals Progress towards OT goals: Progressing toward goals  Plan Discharge plan needs to be updated    Precautions / Restrictions Precautions Precautions: Fall Restrictions Weight Bearing Restrictions: No   Pertinent Vitals/Pain No pain reported.     ADL  Grooming: Teeth care;Wash/dry face;Supervision/safety Where Assessed - Grooming: Supported standing;Unsupported standing Lower Body Bathing: Set up;Supervision/safety Where Assessed - Lower Body Bathing: Supported sitting Upper Body Dressing: Minimal assistance Where Assessed - Upper Body Dressing: Unsupported standing Lower Body Dressing: Supervision/safety;Set up Where Assessed - Lower Body Dressing: Unsupported sit to stand Toilet Transfer: Min guard Toilet Transfer Method: Sit to Barista: Regular height toilet;Grab bars Toileting - Clothing Manipulation and Hygiene: Supervision/safety Where Assessed - Engineer, mining and Hygiene: Other (comment) (sit to stand from chair) Tub/Shower Transfer: Minimal assistance Tub/Shower Transfer Method: Science writer: Other (comment);Shower seat with back (Min A-when going to sit in simulated tub. ) Equipment Used: Gait  belt Transfers/Ambulation Related to ADLs: Supervision/Min guard for transfers and ambulation. ADL Comments: Educated to sit to get LB clothing on and to stand in front of bed/chair when pulling them up. Educated to sit on chair to bathe. Pt practiced simulated tub transfer by stepping over and sitting all the way down and then practiced stepping over and sitting on chair. Educated wife to have rugs picked up (non skid in bathroom) and safe shoewear. Educated on signs and symptoms of stroke and importance of getting help right away. Educated on avoiding canned foods as increased sodium intake increases chance for stroke. Pt able to bend down to retrieve grooming supplies out of bag with standby assistance for balance-difficulty following commands as what objects to retrieve. Checked vision and pt with decreased smoothness with tracking.     OT Diagnosis:    OT Problem List:   OT Treatment Interventions:     OT Goals(current goals can now be found in the care plan section) Acute Rehab OT Goals Patient Stated Goal: didn't state OT Goal Formulation: With patient/family Time For Goal Achievement: 12/19/13 Potential to Achieve Goals: Good ADL Goals Pt Will Perform Grooming: with min assist;standing Pt Will Perform Lower Body Bathing: with min assist;sit to/from stand Pt Will Perform Lower Body Dressing: with min assist;sit to/from stand Pt Will Transfer to Toilet: with min assist;ambulating Pt Will Perform Toileting - Clothing Manipulation and hygiene: with min assist;sit to/from stand Additional ADL Goal #1: Pt will maintain attention to task x 3 minutes with min verbal cues. Additional ADL Goal #2: Pt will follow two step commands within 5 seconds of request during ADL.  Visit Information  Last OT Received On: 12/06/13 Assistance Needed: +1 History of Present Illness: Pt admitted for aphasia and found to have a L frontal infarct.    Subjective Data  Prior Functioning        Cognition  Cognition Arousal/Alertness: Awake/alert Behavior During Therapy: WFL for tasks assessed/performed Overall Cognitive Status: Impaired/Different from baseline Area of Impairment: Following commands Following Commands: Follows multi-step commands inconsistently;Follows one step commands inconsistently Problem Solving: Slow processing General Comments: Pt requiring tactile cues at times when following commands for task. Educated wife on giving simple commands as well as giving tactile cue if needed. Unable to state birthday.    Mobility  Bed Mobility Bed Mobility: Not assessed Transfers Transfers: Sit to Stand;Stand to Sit Sit to Stand: 4: Min guard;5: Supervision;With upper extremity assist;From chair/3-in-1;From toilet Stand to Sit: 5: Supervision;With upper extremity assist;To chair/3-in-1;To toilet Details for Transfer Assistance: Min guard for stand to sit to simulated regular height toilet. Cues for technique.    Exercises      Balance     End of Session OT - End of Session Equipment Utilized During Treatment: Gait belt Activity Tolerance: Patient tolerated treatment well Patient left: in chair;with call bell/phone within reach;with family/visitor present  GO     Earlie Raveling OTR/L [phone] 12/06/2013, 3:58 PM

## 2013-12-06 NOTE — Progress Notes (Signed)
Patient has progressed to OP therapy. [phone]

## 2013-12-06 NOTE — Progress Notes (Signed)
Discharge orders received, pt for discharge home today with home health PT/OT/SLP .  IV D/C, D/C instructions and Rx given with verbalized understanding.  Family at bedside to assist pt with discharge. Staff brought pt downstairs via wheelchair.

## 2013-12-06 NOTE — Discharge Summary (Signed)
Stroke Discharge Summary  Patient ID: Devon Lam   MRN: [id number]      DOB: 1946/04/15  Date of Admission: 12/04/2013 Date of Discharge: 12/06/2013  Attending Physician:  Darcella Cheshire, MD, Stroke MD  Consulting Physician(s):   Treatment Team:  Md Stroke, MD, Natalia Leatherwood, MD (urology), Faith Rogue, MD (Physical Medicine & Rehabtilitation)  Patient's PCP:  No primary provider on file.  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  Past Medical History  Diagnosis Date  . A-fib    History reviewed. No pertinent past surgical history.    Medication List         aspirin 325 MG tablet  Take 1 tablet (325 mg total) by mouth daily.  Start taking on:  12/07/2013     metoprolol 50 MG tablet  Commonly known as:  LOPRESSOR  Take 50 mg by mouth 2 (two) times daily.     Rivaroxaban 20 MG Tabs tablet  Commonly known as:  XARELTO  Take 1 tablet (20 mg total) by mouth daily.  Start taking on:  12/11/2013        LABORATORY STUDIES CBC    Component Value Date/Time   WBC 7.1 12/04/2013 0526   RBC 4.96 12/04/2013 0526   HGB 15.5 12/04/2013 0526   HCT 42.8 12/04/2013 0526   PLT 161 12/04/2013 0526   MCV 86.3 12/04/2013 0526   MCH 31.3 12/04/2013 0526   MCHC 36.2* 12/04/2013 0526   RDW 13.4 12/04/2013 0526   LYMPHSABS 0.9 12/04/2013 0526   MONOABS 0.5 12/04/2013 0526   EOSABS 0.1 12/04/2013 0526   BASOSABS 0.0 12/04/2013 0526   CMP    Component Value Date/Time   NA 140 12/04/2013 0526   K 3.9 12/04/2013 0526   CL 107 12/04/2013 0526   CO2 23 12/04/2013 0526   GLUCOSE 122* 12/04/2013 0526   BUN 10 12/04/2013 0526   CREATININE 0.78 12/04/2013 0526   CALCIUM 8.6 12/04/2013 0526   GFRNONAA >90 12/04/2013 0526   GFRAA >90 12/04/2013 0526   COAGS No results found for this basename: INR, PROTIME   Lipid Panel    Component Value Date/Time   CHOL 160 12/04/2013 0536   TRIG 106 12/04/2013 0536   HDL 35* 12/04/2013 0536   CHOLHDL 4.6 12/04/2013 0536   VLDL 21 12/04/2013 0536   LDLCALC 104* 12/04/2013 0536   HgbA1C  Lab Results  Component Value Date   HGBA1C 6.2* 12/04/2013   Cardiac Panel (last 3 results) No results found for this basename: CKTOTAL, CKMB, TROPONINI, RELINDX,  in the last 72 hours Urinalysis No results found for this basename: colorurine, appearanceur, labspec, phurine, glucoseu, hgbur, bilirubinur, ketonesur, proteinur, urobilinogen, nitrite, leukocytesur   Urine Drug Screen  No results found for this basename: labopia, cocainscrnur, labbenz, amphetmu, thcu, labbarb    Alcohol Level No results found for this basename: eth     SIGNIFICANT DIAGNOSTIC STUDIES CT of the brain 12/05/2013 Acute left frontal infarct. No hemorrhagic conversion or evidence of infarct extension.  CT angio head and neck 12/04/2013 : 1. Normal CTA of the brain without evidence of proximal arterial occlusion, intraluminal clot, high-grade flow-limiting stenosis, or intracranial aneurysm. 2. Unremarkable CT of the neck without evidence of high-grade stenosis or  dissection.  MRI of the brain 12/04/2013 Acute infarct left frontal lobe with a small amount of hemorrhage. This involves the operculum and left frontal lobe.  MRA of the brain See CT angio  2D Echocardiogram EF 50-55% with no source of embolus.  Carotid Doppler See CT angio  CXR 12/04/2013 Mild prominence of the interstitial markings accentuated by the patient's low lung volumes. Component of pulmonary vascular congestion is of diagnostic consideration. No focal region of consolidation or focal infiltrates.  EKG .  Therapy Recommendations CIR     History of Present Illness   Devon Lam is a 67 y.o. male  with a history of atrial fibrillation who was in his normal state of health at 7 pm 12/03/2013 when his family talked to him, then when his wife arrrived home at 7:30pm found him to be confused. He was brought to [facility] ED where he was seen to be aphasic and therefore tPA was administered. He was given tPA at 12/03/2013 22:32. He was transferred here following this. On arrival, Dr. Amada Jupiter accompanied the patient to CT angiogram which did not reveal any clear intervenable lesions and therefore no further acute therapy was pursued. He was admitted to the neuro ICU for further evaluation and treatment.   Hospital Course Patient tolerated tPA without complication. Imaging at 24 hours shows no hemorrhage. MRI confirmed ischemic infarct in the left frontal lobe with petechial hemorrhagic transformation (not significant). Stroke was found to be embolic secondary to known atrial fibrillation. He was on  aspirin 325 mg orally every day prior to admission. CHA2DS2-VASc Score for Atrial Fibrillation Stroke Risk = 4. He was started on aspirin 325 mg daily in hospital with plans to change to xarelto 20 mg daily for secondary stroke prevention 1 week post stroke.  Patient also with  Urinary retention, urology consulted, able to in and out cath with ease (removed due to fear of traumatic dislodgement per pt). Suspect BPH but urologist unable to perform rectal exam due to agitation. No further problems noted in hospital.   Patient with vascular risk factors of:  Hypertension Family hx stroke (mother)  Patient with continued stroke symptoms of aphasia and mile right hemiparesis. Physical therapy, occupational therapy and speech therapy evaluated patient. They recommend CIR. Rehab MD consulted - due to pt improvement within first 24h, recommendations were made for OP therapy followup. Pt and wife are agreeable.  Discharge Exam  Blood pressure 141/98, pulse 94, temperature 97.7 F (36.5 C), temperature source  Oral, resp. rate 20, height 6\' 4"  (1.93 m), weight 106.4 kg (234 lb 9.1 oz), SpO2 98.00%.  GENERAL EXAM:  Patient is in no distress; well developed, nourished and groomed  CARDIOVASCULAR:  Regular rate and rhythm, no murmurs, no carotid bruits  NEUROLOGIC:  MENTAL STATUS: awake, alert, SIGNIFICANT EXPRESSIVE APHASIA. ABLE TO NAME PEN. CANNOT TELL ME HIS NAME, BUT ABLE TO REPEAT HIS NAME. COMPREHENSION INTACT TO SIMPLE COMMANDS, BUT CANNOT TOUCH HIS OWN NOSE TO COMMAND.  CRANIAL NERVE: pupils equal and reactive to light, visual fields full to confrontation, extraocular muscles intact, no nystagmus, facial sensation and strength symmetric, hearing intact, palate elevates symmetrically, uvula midline, shoulder shrug symmetric, tongue midline.  MOTOR: normal bulk and tone, full strength in the BUE, BLE  SENSORY: normal and symmetric to light touch  COORDINATION: DIFFICULTY FOLLOWING COMMANDS TO TEST COORDINATION  REFLEXES: deep tendon reflexes present and symmetric  GAIT/STATION: IN BED, SITTING UP   Discharge Diet   Dysphagia 3 thin liquids  Discharge Plan  Disposition:  Home with wife  Outpatient PT, ST and OT.   Do not work or drive at this time.   aspirin 325 mg orally every day for secondary stroke prevention x 1 week then start xarelto 20 mg daily; stop aspirin at that time  Ongoing risk factor control by Primary Care Physician.  Follow-up with a primary provider in 1 month.   Follow-up with Dr. Delia Heady, Stroke Clinic in 2 months.  40 minutes were spent preparing discharge.  Signed Annie Main, AVNP, ANP-BC, [facility] Nurse Practitioner 12/06/2013, 12:45 PM  I have personally examined this patient, reviewed pertinent data and developed the plan of care. I agree with above. Delia Heady, MD

## 2014-02-16 ENCOUNTER — Ambulatory Visit: Payer: Self-pay | Admitting: Neurology

## 2014-02-23 DIAGNOSIS — Z0289 Encounter for other administrative examinations: Secondary | ICD-10-CM

## 2014-02-27 ENCOUNTER — Telehealth: Payer: Self-pay | Admitting: Neurology

## 2014-02-27 MED ORDER — RIVAROXABAN 20 MG PO TABS: 20.0000 mg | ORAL_TABLET | Freq: Every day | ORAL | Status: DC

## 2014-02-27 NOTE — Telephone Encounter (Signed)
Rx has been sent.  Called patient back, got no answer, no VM.

## 2014-02-28 ENCOUNTER — Telehealth: Payer: Self-pay | Admitting: Neurology

## 2014-02-28 NOTE — Telephone Encounter (Signed)
Pt needs to go to the dentist and needs our approval for Dentist to fix pt's tooth. They state they won't use drugs but pt's wife [name] wants someone to call her concerning this matter. Vicki's cell [phone]

## 2014-02-28 NOTE — Telephone Encounter (Signed)
The risk of having another stroke is maximum in the first 3 months after the stroke. Unless it is an emergency I would recommend the patient wait for this period atleast. As he may have to stop his blood thinner for the dental procedure which carries a small risk of periprocedural stroke.

## 2014-02-28 NOTE — Telephone Encounter (Signed)
Patient wife is requesting clearance for the patient to have dental work done. Please advise

## 2014-03-01 NOTE — Telephone Encounter (Signed)
Called patient and spoke with patient's wife [name] concerning patient waiting until after the first 3 month since the patient's stroke, because patient may have to stop his blood thinner for the dental procedure which carries a small risk of peri procedural. Patient's wife stated that the patient was going to have a filling done, but they would wait until the 3 months is up. I advised the patient's wife that if the patient has any other problems, questions or concerns to call the office. Patient's wife verbalized understanding.

## 2014-03-16 ENCOUNTER — Ambulatory Visit (INDEPENDENT_AMBULATORY_CARE_PROVIDER_SITE_OTHER): Payer: BC Managed Care – PPO | Admitting: Neurology

## 2014-03-16 ENCOUNTER — Encounter (INDEPENDENT_AMBULATORY_CARE_PROVIDER_SITE_OTHER): Payer: Self-pay

## 2014-03-16 ENCOUNTER — Encounter: Payer: Self-pay | Admitting: Neurology

## 2014-03-16 VITALS — BP 131/88 | HR 72 | Ht 75.0 in | Wt 230.0 lb

## 2014-03-16 DIAGNOSIS — I639 Cerebral infarction, unspecified: Secondary | ICD-10-CM

## 2014-03-16 DIAGNOSIS — I6340 Cerebral infarction due to embolism of unspecified cerebral artery: Secondary | ICD-10-CM

## 2014-03-16 NOTE — Patient Instructions (Addendum)
I had a long discussion with the patient and his wife regarding his recovering aphasia, recent stroke, discuss results of hospital evaluation and answered questions. Continue her Rivaroxaban for secondary stroke prevention. Continue outpatient speech therapy.His disability paperwork was filled out. Consider referral to driver's education school to assess his driving abilities prior to starting to drive again. Return for followup in 3 months with [name], NP or call earlier if necessary Stroke Prevention Some medical conditions and behaviors are associated with an increased chance of having a stroke. You may prevent a stroke by making healthy choices and managing medical conditions. HOW CAN I REDUCE MY RISK OF HAVING A STROKE?   Stay physically active. Get at least 30 minutes of activity on most or all days.  Do not smoke. It may also be helpful to avoid exposure to secondhand smoke.  Limit alcohol use. Moderate alcohol use is considered to be:  No more than 2 drinks per day for men.  No more than 1 drink per day for nonpregnant women.  Eat healthy foods. This involves  Eating 5 or more servings of fruits and vegetables a day.  Following a diet that addresses high blood pressure (hypertension), high cholesterol, diabetes, or obesity.  Manage your cholesterol levels.  A diet low in saturated fat, trans fat, and cholesterol and high in fiber may control cholesterol levels.  Take any prescribed medicines to control cholesterol as directed by your health care provider.  Manage your diabetes.  A controlled-carbohydrate, controlled-sugar diet is recommended to manage diabetes.  Take any prescribed medicines to control diabetes as directed by your health care provider.  Control your hypertension.  A low-salt (sodium), low-saturated fat, low-trans fat, and low-cholesterol diet is recommended to manage hypertension.  Take any prescribed medicines to control hypertension as directed by  your health care provider.  Maintain a healthy weight.  A reduced-calorie, low-sodium, low-saturated fat, low-trans fat, low-cholesterol diet is recommended to manage weight.  Stop drug abuse.  Avoid taking birth control pills.  Talk to your health care provider about the risks of taking birth control pills if you are over 47 years old, smoke, get migraines, or have ever had a blood clot.  Get evaluated for sleep disorders (sleep apnea).  Talk to your health care provider about getting a sleep evaluation if you snore a lot or have excessive sleepiness.  Take medicines as directed by your health care provider.  For some people, aspirin or blood thinners (anticoagulants) are helpful in reducing the risk of forming abnormal blood clots that can lead to stroke. If you have the irregular heart rhythm of atrial fibrillation, you should be on a blood thinner unless there is a good reason you cannot take them.  Understand all your medicine instructions.  Make sure that other other conditions (such as anemia or atherosclerosis) are addressed. SEEK IMMEDIATE MEDICAL CARE IF:   You have sudden weakness or numbness of the face, arm, or leg, especially on one side of the body.  Your face or eyelid droops to one side.  You have sudden confusion.  You have trouble speaking (aphasia) or understanding.  You have sudden trouble seeing in one or both eyes.  You have sudden trouble walking.  You have dizziness.  You have a loss of balance or coordination.  You have a sudden, severe headache with no known cause.  You have new chest pain or an irregular heartbeat. Any of these symptoms may represent a serious problem that is an emergency. Do  not wait to see if the symptoms will go away. Get medical help at once. Call your local emergency services  (911 in [place] Do not drive yourself to the hospital. Document Released: 01/15/2005 Document Revised: 09/28/2013 Document Reviewed:  06/10/2013 [facility] Patient Information 2014 [place].

## 2014-03-17 NOTE — Progress Notes (Signed)
[name] [address] [place]. [place] [address] [phone]       OFFICE FOLLOW-UP NOTE  [name] Date of Birth:  April 14, 1946 Medical Record Number:  [id number]   HPI: 68 year male seen for first office followup visit for hospital admission for stroke in December 2014. He developed sudden onset of confusion and aphasia at home and was brought to the hospital in a timely fashion and administered IV t-PA uneventfully. CT angiogram did not reveal any large vessel occlusion. He was admitted to the neuro ICU and remained stable and blood pressure was tightly controlled. MRI scan confirmed ischemic infarct and left frontal lobes with slight petechial. transformation which was not felt to be significant. He had no history of rectal fibrillation. He was started on rewaxed amounts of second stroke prevention. Hemoglobin A1c was borderline at 6.2. Lipid profile significant only for borderline LDL of 104 patient sent aphasia improved but he still had some expressive language difficulties at the time of discharge he was discharged home and hospital and outpatient physical occupational and speech therapy and has done well. History of dislocation confusion and has increased reaction time and speaking and hit speech is hesitant. [name] is excited exhausted or tired his speech is non-hesitant. Patient's feels his gait and strength is good. He feels he is not depressed. 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  ROS:   14 system review of systems is positive for snoring, feeling hot, confusion, anxiety, not enough sleep, runny nose, snoring and all the systems  PMH:  Past Medical History  Diagnosis Date  . A-fib   . Anxiety   . Stroke     Social History:  History   Social History  . Marital Status: Married    Spouse Name: N/A    Number of Children: 2  . Years of Education: college    Occupational History  . retired    Social History Main Topics  . Smoking status: Never Smoker   . Smokeless tobacco: Never Used  . Alcohol Use: No  . Drug Use: No  . Sexual Activity: Not on file   Other Topics Concern  . Not on file   Social History Narrative   Patient lives at home with his wife    Patient drinks pepsi    Medications:   Current Outpatient Prescriptions on File Prior to Visit  Medication Sig Dispense Refill  . metoprolol (LOPRESSOR) 50 MG tablet Take 50 mg by mouth 2 (two) times daily.      . Rivaroxaban (XARELTO) 20 MG TABS tablet Take 1 tablet (20 mg total) by mouth daily.  90 tablet  0   No current facility-administered medications on file prior to visit.    Allergies:  No Known Allergies  Physical Exam General: well developed, well nourished, seated, in no evident distress Head: head normocephalic and atraumatic. Orohparynx benign Neck: supple with no carotid or supraclavicular bruits Cardiovascular: regular rate and rhythm, no murmurs Musculoskeletal: no deformity Skin:  no rash/petichiae Vascular:  Normal pulses all extremities Filed Vitals:   03/16/14 1418  BP: 131/88  Pulse: 72   Neurologic Exam Mental Status: Awake and fully alert. Oriented to place and time. Recent and remote memory intact. Attention span, concentration and fund of knowledge appropriate. Mood and affect appropriate. Mild hesitant speech with some word finding difficulties Cranial Nerves: Fundoscopic exam reveals sharp disc margins.  Pupils equal, briskly reactive to light. Extraocular movements full without nystagmus. Visual fields full to confrontation. Hearing intact. Facial sensation intact. Face, tongue, palate moves normally and symmetrically.  Motor: Normal bulk and tone. Normal strength in all tested extremity muscles. Sensory.: intact to touch and pinprick and vibratory sensation.  Coordination: Rapid alternating movements normal in all extremities. Finger-to-nose  and heel-to-shin performed accurately bilaterally. Gait and Station: Arises from chair without difficulty. Stance is normal. Gait demonstrates normal stride length and balance . Able to heel, toe and tandem walk without difficulty.  Reflexes: 1+ and symmetric. Toes downgoing.   NIHSS  1 Modified Rankin  1   ASSESSMENT: 68 year old male with left frontoparietal embolic infarct secondary to atrial fibrillation in December 2014 treated with IV TPA.    PLAN: I had a long discussion with the patient and his wife regarding his recovering aphasia, recent stroke, discuss results of hospital evaluation and answered questions. Continue her Rivaroxaban for secondary stroke prevention. Continue outpatient speech therapy.His disability paperwork was filled out. Consider referral to driver's education school to assess his driving abilities prior to starting to drive again. Return for followup in 3 months with [name], NP or call earlier if necessary   Note: This document was prepared with digital dictation and possible smart phrase technology. Any transcriptional errors that result from this process are unintentional

## 2014-06-21 ENCOUNTER — Ambulatory Visit (INDEPENDENT_AMBULATORY_CARE_PROVIDER_SITE_OTHER): Payer: Medicare Other | Admitting: Nurse Practitioner

## 2014-06-21 ENCOUNTER — Encounter: Payer: Self-pay | Admitting: Nurse Practitioner

## 2014-06-21 VITALS — BP 151/92 | HR 80 | Wt 246.0 lb

## 2014-06-21 DIAGNOSIS — I6340 Cerebral infarction due to embolism of unspecified cerebral artery: Secondary | ICD-10-CM

## 2014-06-21 DIAGNOSIS — I482 Chronic atrial fibrillation, unspecified: Secondary | ICD-10-CM

## 2014-06-21 DIAGNOSIS — I4891 Unspecified atrial fibrillation: Secondary | ICD-10-CM

## 2014-06-21 MED ORDER — RIVAROXABAN 20 MG PO TABS: 20.0000 mg | ORAL_TABLET | Freq: Every day | ORAL | Status: DC

## 2014-06-21 NOTE — Patient Instructions (Addendum)
Continue Rivaroxaban for secondary stroke prevention. Maintain strict control of hypertension with blood pressure goal below 140/90, diabetes with hemoglobin A1c goal below 7% and lipids with LDL cholesterol goal below 100 mg/dL.   Return for followup in 6 months or call earlier if necessary.  Stroke Prevention Some medical conditions and behaviors are associated with an increased chance of having a stroke. You may prevent a stroke by making healthy choices and managing medical conditions. HOW CAN I REDUCE MY RISK OF HAVING A STROKE?   Stay physically active. Get at least 30 minutes of activity on most or all days.  Do not smoke. It may also be helpful to avoid exposure to secondhand smoke.  Limit alcohol use. Moderate alcohol use is considered to be:  No more than 2 drinks per day for men.  No more than 1 drink per day for nonpregnant women.  Eat healthy foods. This involves  Eating 5 or more servings of fruits and vegetables a day.  Following a diet that addresses high blood pressure (hypertension), high cholesterol, diabetes, or obesity.  Manage your cholesterol levels.  A diet low in saturated fat, trans fat, and cholesterol and high in fiber may control cholesterol levels.  Take any prescribed medicines to control cholesterol as directed by your health care provider.  Manage your diabetes.  A controlled-carbohydrate, controlled-sugar diet is recommended to manage diabetes.  Take any prescribed medicines to control diabetes as directed by your health care provider.  Control your hypertension.  A low-salt (sodium), low-saturated fat, low-trans fat, and low-cholesterol diet is recommended to manage hypertension.  Take any prescribed medicines to control hypertension as directed by your health care provider.  Maintain a healthy weight.  A reduced-calorie, low-sodium, low-saturated fat, low-trans fat, low-cholesterol diet is recommended to manage weight.  Stop drug  abuse.  Avoid taking birth control pills.  Talk to your health care provider about the risks of taking birth control pills if you are over 44 years old, smoke, get migraines, or have ever had a blood clot.  Get evaluated for sleep disorders (sleep apnea).  Talk to your health care provider about getting a sleep evaluation if you snore a lot or have excessive sleepiness.  Take medicines as directed by your health care provider.  For some people, aspirin or blood thinners (anticoagulants) are helpful in reducing the risk of forming abnormal blood clots that can lead to stroke. If you have the irregular heart rhythm of atrial fibrillation, you should be on a blood thinner unless there is a good reason you cannot take them.  Understand all your medicine instructions.  Make sure that other other conditions (such as anemia or atherosclerosis) are addressed. SEEK IMMEDIATE MEDICAL CARE IF:   You have sudden weakness or numbness of the face, arm, or leg, especially on one side of the body.  Your face or eyelid droops to one side.  You have sudden confusion.  You have trouble speaking (aphasia) or understanding.  You have sudden trouble seeing in one or both eyes.  You have sudden trouble walking.  You have dizziness.  You have a loss of balance or coordination.  You have a sudden, severe headache with no known cause.  You have new chest pain or an irregular heartbeat. Any of these symptoms may represent a serious problem that is an emergency. Do not wait to see if the symptoms will go away. Get medical help at once. Call your local emergency services  (911 in [place] Do not  drive yourself to the hospital. Document Released: 01/15/2005 Document Revised: 09/28/2013 Document Reviewed: 06/10/2013 [facility] Patient Information 2015 [place], [place]. This information is not intended to replace advice given to you by your health care provider. Make sure you discuss any questions you have with  your health care provider.

## 2014-06-21 NOTE — Progress Notes (Signed)
PATIENT: Devon Lam DOB: [birth year]  REASON FOR VISIT: routine follow up for HISTORY FROM: patient  HISTORY OF PRESENT ILLNESS: 68 year male seen for first office followup visit for hospital admission for stroke in December 2014. He developed sudden onset of confusion and aphasia at home and was brought to the hospital in a timely fashion and administered IV t-PA uneventfully. CT angiogram did not reveal any large vessel occlusion. He was admitted to the neuro ICU and remained stable and blood pressure was tightly controlled. MRI scan confirmed ischemic infarct and left frontal lobes with slight petechial transformation which was not felt to be significant. He had no history of atrial fibrillation. He was started on Xarelto for second stroke prevention. Hemoglobin A1c was borderline at 6.2. Lipid profile significant only for borderline LDL of 104 patient says aphasia improved but he still had some expressive language difficulties at the time of discharge. He was discharged home and hospital and outpatient physical occupational and speech therapy and has done well. History of dislocation confusion and has increased reaction time and his speech is hesitant. When he is excited, exhausted, or tired his speech is more hesitant. Patient's feels his gait and strength is good. He feels he is not depressed. 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   UPDATE 06/21/14 (LL): Since last visit, patient has had some minor improvement with speech, wife still notices cognitive slowing and slower reaction time. She has not allowed him to drive.  He has gained some weight due to not being as active.  Patient states that blood pressure is well controlled although it is elevated in the office today at 151/92.  He is tolerating Xarelto well with no signs of significant bleeding or bruising.  He has developed a rash on his back about 1  week ago, wife is concerned that it may be allergy to medication.  They have put hydrocortisone cream on it on advice from PCP, and it seems to be getting better.  Wife had recently changed to a different washing detergent with fragrance.  He has had some mild left eye redness as well without drainage.    ROS:  14 system review of systems is positive for anxiety, runny nose, eye redness, rash and all the systems negative.  ALLERGIES: No Known Allergies  HOME MEDICATIONS: Outpatient Prescriptions Prior to Visit  Medication Sig Dispense Refill  . metoprolol (LOPRESSOR) 50 MG tablet Take 50 mg by mouth 2 (two) times daily.      . Rivaroxaban (XARELTO) 20 MG TABS tablet Take 1 tablet (20 mg total) by mouth daily.  90 tablet  0   No facility-administered medications prior to visit.    PHYSICAL EXAM Filed Vitals:   06/21/14 1452  BP: 151/92  Pulse: 80  Weight: 246 lb (111.585 kg)   Body mass index is 30.75 kg/(m^2). No exam data present No flowsheet data found.  Physical Exam  General: well developed, well nourished, seated, in no evident distress  Head: head normocephalic and atraumatic. Orohparynx benign  Neck: supple with no carotid or supraclavicular bruits  Cardiovascular: Irregular rate and rhythm, no murmurs  Musculoskeletal: no deformity  Skin: mild nummular macular rash on back Vascular: Normal pulses all extremities   Neurologic Exam  Mental Status: Awake and fully alert. Oriented to place and time. Recent and remote memory intact. Attention span, concentration and fund of knowledge appropriate.  Mood and affect appropriate. Mild hesitant speech with some word finding difficulties  Cranial Nerves: Fundoscopic exam not done. Pupils equal, briskly reactive to light. Extraocular movements full without nystagmus. Visual fields full to confrontation. Hearing intact. Facial sensation intact. Face, tongue, palate moves normally and symmetrically.  Motor: Normal bulk and tone.  Normal strength in all tested extremity muscles.  Sensory: intact to touch and pinprick and vibratory sensation.  Coordination: Rapid alternating movements normal in all extremities. Finger-to-nose and heel-to-shin performed accurately bilaterally.  Gait and Station: Arises from chair without difficulty. Stance is normal. Gait demonstrates normal stride length and balance . Able to heel, toe and tandem walk without difficulty.  Reflexes: 1+ and symmetric. Toes downgoing.   ASSESSMENT: 68 year old male with left frontoparietal embolic infarct secondary to atrial fibrillation in December 2014 treated with IV TPA.  Residual mild hesitant speech with some word finding difficulties.  PLAN:  I had a long discussion with the patient and his wife regarding his recovering aphasia, recent stroke, discuss results of hospital evaluation and answered questions. I advised to avoid detergents with fragrance added for patient's clothing as his rash may be due to new detergent.  If rash not cleared in 1 week, consult dermatologist.  Reassured that rash is not likely from medication because he has been on all his medications for months without problems. Continue Rivaroxaban for atrial fibrillation and secondary stroke prevention. New Rx given. Consider referral to driver's education school to assess his driving abilities prior to starting to drive again.  Return for followup in 6 months or call earlier if necessary.  Meds ordered this encounter  Medications  . atorvastatin (LIPITOR) 20 MG tablet    Sig: Take 20 mg by mouth daily.  . rivaroxaban (XARELTO) 20 MG TABS tablet    Sig: Take 1 tablet (20 mg total) by mouth daily.    Dispense:  30 tablet    Refill:  12    Order Specific Question:  Supervising Provider    Answer:  Garvin Fila [[id number]]   Return in about 6 months (around 12/22/2014) for stroke revisit.  Philmore Pali, MSN, NP-C 06/21/2014, 3:41 PM [name] [address] [place], [place] [address] [phone]  Note: This document was prepared with digital dictation and possible smart phrase technology. Any transcriptional errors that result from this process are unintentional.

## 2014-06-22 ENCOUNTER — Telehealth: Payer: Self-pay | Admitting: Nurse Practitioner

## 2014-06-22 NOTE — Telephone Encounter (Signed)
please call [phone] to get PA for rivaroxaban (XARELTO) 20 MG TABS tablet

## 2014-06-22 NOTE — Telephone Encounter (Signed)
I have contacted ins to provide all requested info.  Pending response.

## 2014-06-22 NOTE — Telephone Encounter (Signed)
Pharmacy needs a PA for

## 2014-06-23 NOTE — Progress Notes (Signed)
I agree with above 

## 2014-07-02 ENCOUNTER — Telehealth: Payer: Self-pay

## 2014-07-02 NOTE — Telephone Encounter (Signed)
[employer] [employer] sent [place] a letter saying they have approved our request for coverage on Xarelto effective until 06/25/2015 Ref # [id number]

## 2014-11-02 ENCOUNTER — Telehealth: Payer: Self-pay | Admitting: Neurology

## 2014-11-02 NOTE — Telephone Encounter (Signed)
Spouse returned call and I informed spouse of Dr. Clydene Fake phone message.  She indicated episode only lasted less than 5 minutes.  Questioning if she should follow Dr. Clydene Fake instructions.  Please call and advise.

## 2014-11-02 NOTE — Telephone Encounter (Signed)
I tried calling the listed home number but nobody picked up and was unable to leave a message. The patient needs to stop xarelto and see his primary physicians/dentist until the bleeding stops and then discuss restarting the medication

## 2014-11-02 NOTE — Telephone Encounter (Signed)
Patient calling in stating that his gums were bleeding, states that he is on blood thinners is this anything that patient should be concerned of please advise.

## 2014-11-02 NOTE — Telephone Encounter (Signed)
Spoke to spouse. Advised should still follow Dr. Clydene Fake previous instructions. Spouse agreed.

## 2014-11-02 NOTE — Telephone Encounter (Signed)
Patient's wife is calling. Patient was brushing his teeth this morning and his gums started bleeding. Patient does take Xarelto. Should patient be concerned since he is on this blood thinner. Please call.

## 2014-11-03 ENCOUNTER — Encounter: Payer: Self-pay | Admitting: Neurology

## 2014-12-08 ENCOUNTER — Emergency Department: Payer: Self-pay | Admitting: Emergency Medicine

## 2014-12-08 LAB — HEPATIC FUNCTION PANEL A (ARMC)
Albumin: 3.8 g/dL (ref 3.4–5.0)
Alkaline Phosphatase: 88 U/L
Bilirubin, Direct: 0.3 mg/dL — ABNORMAL HIGH (ref 0.0–0.2)
Bilirubin,Total: 1.6 mg/dL — ABNORMAL HIGH (ref 0.2–1.0)
SGOT(AST): 38 U/L — ABNORMAL HIGH (ref 15–37)
SGPT (ALT): 52 U/L
Total Protein: 7.1 g/dL (ref 6.4–8.2)

## 2014-12-08 LAB — BASIC METABOLIC PANEL
Anion Gap: 8 (ref 7–16)
BUN: 12 mg/dL (ref 7–18)
CO2: 24 mmol/L (ref 21–32)
CREATININE: 0.95 mg/dL (ref 0.60–1.30)
Calcium, Total: 9.7 mg/dL (ref 8.5–10.1)
Chloride: 108 mmol/L — ABNORMAL HIGH (ref 98–107)
EGFR (Non-African Amer.): >60
Glucose: 131 mg/dL — ABNORMAL HIGH (ref 65–99)
Osmolality: 281 (ref 275–301)
Potassium: 4.3 mmol/L (ref 3.5–5.1)
Sodium: 140 mmol/L (ref 136–145)

## 2014-12-08 LAB — LIPASE, BLOOD: Lipase: 87 U/L (ref 73–393)

## 2014-12-08 LAB — CBC
HCT: 46.2 % (ref 40.0–52.0)
HGB: 15.6 g/dL (ref 13.0–18.0)
MCH: 30.1 pg (ref 26.0–34.0)
MCHC: 33.8 g/dL (ref 32.0–36.0)
MCV: 89 fL (ref 80–100)
Platelet: 192 10*3/uL (ref 150–440)
RBC: 5.19 10*6/uL (ref 4.40–5.90)
RDW: 14.3 % (ref 11.5–14.5)
WBC: 7.5 10*3/uL (ref 3.8–10.6)

## 2014-12-08 LAB — PRO B NATRIURETIC PEPTIDE: B-TYPE NATIURETIC PEPTID: 1082 pg/mL — AB (ref 0–125)

## 2014-12-08 LAB — PROTIME-INR
INR: 1.3
PROTHROMBIN TIME: 15.5 s — AB (ref 11.5–14.7)

## 2014-12-08 LAB — TROPONIN I

## 2014-12-26 ENCOUNTER — Ambulatory Visit: Payer: Medicare Other | Admitting: Nurse Practitioner

## 2015-01-26 ENCOUNTER — Ambulatory Visit: Payer: Medicare Other | Admitting: Neurology

## 2015-02-05 ENCOUNTER — Ambulatory Visit: Payer: Self-pay | Admitting: Neurology

## 2015-02-09 ENCOUNTER — Ambulatory Visit (INDEPENDENT_AMBULATORY_CARE_PROVIDER_SITE_OTHER): Payer: 59 | Admitting: Neurology

## 2015-02-09 ENCOUNTER — Encounter: Payer: Self-pay | Admitting: Neurology

## 2015-02-09 VITALS — BP 133/92 | HR 59 | Ht 75.0 in | Wt 246.0 lb

## 2015-02-09 DIAGNOSIS — I69320 Aphasia following cerebral infarction: Secondary | ICD-10-CM

## 2015-02-09 NOTE — Patient Instructions (Signed)
I had a long d/w patient about his remote stroke, risk for recurrent stroke/TIAs, personally independently reviewed imaging studies and stroke evaluation results and answered questions.Continue Xarelto  for secondary stroke prevention due to atrial fibrillation and maintain strict control of hypertension with blood pressure goal below 130/90, and lipids with LDL cholesterol goal below 100 mg/dL. I also advised the patient to eat a healthy diet with plenty of whole grains, cereals, fruits and vegetables, exercise regularly and maintain ideal body weight Followup in the future with me in  6 months. Stroke Prevention Some medical conditions and behaviors are associated with an increased chance of having a stroke. You may prevent a stroke by making healthy choices and managing medical conditions. HOW CAN I REDUCE MY RISK OF HAVING A STROKE?   Stay physically active. Get at least 30 minutes of activity on most or all days.  Do not smoke. It may also be helpful to avoid exposure to secondhand smoke.  Limit alcohol use. Moderate alcohol use is considered to be:  No more than 2 drinks per day for men.  No more than 1 drink per day for nonpregnant women.  Eat healthy foods. This involves:  Eating 5 or more servings of fruits and vegetables a day.  Making dietary changes that address high blood pressure (hypertension), high cholesterol, diabetes, or obesity.  Manage your cholesterol levels.  Making food choices that are high in fiber and low in saturated fat, trans fat, and cholesterol may control cholesterol levels.  Take any prescribed medicines to control cholesterol as directed by your health care provider.  Manage your diabetes.  Controlling your carbohydrate and sugar intake is recommended to manage diabetes.  Take any prescribed medicines to control diabetes as directed by your health care provider.  Control your hypertension.  Making food choices that are low in salt (sodium),  saturated fat, trans fat, and cholesterol is recommended to manage hypertension.  Take any prescribed medicines to control hypertension as directed by your health care provider.  Maintain a healthy weight.  Reducing calorie intake and making food choices that are low in sodium, saturated fat, trans fat, and cholesterol are recommended to manage weight.  Stop drug abuse.  Avoid taking birth control pills.  Talk to your health care provider about the risks of taking birth control pills if you are over 37 years old, smoke, get migraines, or have ever had a blood clot.  Get evaluated for sleep disorders (sleep apnea).  Talk to your health care provider about getting a sleep evaluation if you snore a lot or have excessive sleepiness.  Take medicines only as directed by your health care provider.  For some people, aspirin or blood thinners (anticoagulants) are helpful in reducing the risk of forming abnormal blood clots that can lead to stroke. If you have the irregular heart rhythm of atrial fibrillation, you should be on a blood thinner unless there is a good reason you cannot take them.  Understand all your medicine instructions.  Make sure that other conditions (such as anemia or atherosclerosis) are addressed. SEEK IMMEDIATE MEDICAL CARE IF:   You have sudden weakness or numbness of the face, arm, or leg, especially on one side of the body.  Your face or eyelid droops to one side.  You have sudden confusion.  You have trouble speaking (aphasia) or understanding.  You have sudden trouble seeing in one or both eyes.  You have sudden trouble walking.  You have dizziness.  You have a loss  of balance or coordination.  You have a sudden, severe headache with no known cause.  You have new chest pain or an irregular heartbeat. Any of these symptoms may represent a serious problem that is an emergency. Do not wait to see if the symptoms will go away. Get medical help at once. Call  your local emergency services (911 in [place] Do not drive yourself to the hospital. Document Released: 01/15/2005 Document Revised: 04/24/2014 Document Reviewed: 06/10/2013 [facility] Patient Information 2015 [place], [place]. This information is not intended to replace advice given to you by your health care provider. Make sure you discuss any questions you have with your health care provider.

## 2015-02-10 NOTE — Progress Notes (Signed)
PATIENT: Devon Lam DOB: 11/10/1946  REASON FOR VISIT: routine follow up for HISTORY FROM: patient  HISTORY OF PRESENT ILLNESS: 69 year male seen for first office followup visit for hospital admission for stroke in December 2014. He developed sudden onset of confusion and aphasia at home and was brought to the hospital in a timely fashion and administered IV t-PA uneventfully. CT angiogram did not reveal any large vessel occlusion. He was admitted to the neuro ICU and remained stable and blood pressure was tightly controlled. MRI scan confirmed ischemic infarct and left frontal lobes with slight petechial transformation which was not felt to be significant. He had no history of atrial fibrillation. He was started on Xarelto for second stroke prevention. Hemoglobin A1c was borderline at 6.2. Lipid profile significant only for borderline LDL of 104 patient says aphasia improved but he still had some expressive language difficulties at the time of discharge. He was discharged home and hospital and outpatient physical occupational and speech therapy and has done well. History of dislocation confusion and has increased reaction time and his speech is hesitant. When he is excited, exhausted, or tired his speech is more hesitant. Patient's feels his gait and strength is good. He feels he is not depressed. 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   UPDATE 06/21/14 (LL): Since last visit, patient has had some minor improvement with speech, wife still notices cognitive slowing and slower reaction time. She has not allowed him to drive.  He has gained some weight due to not being as active.  Patient states that blood pressure is well controlled although it is elevated in the office today at 151/92.  He is tolerating Xarelto well with no signs of significant bleeding or bruising.  He has developed a rash on his back about 1  week ago, wife is concerned that it may be allergy to medication.  They have put hydrocortisone cream on it on advice from PCP, and it seems to be getting better.  Wife had recently changed to a different washing detergent with fragrance.  He has had some mild left eye redness as well without drainage.   UPDATE 02/09/2015 : He returns for follow-up after last visit 6 months ago. He continues to have occasional speech hesitancy particularly when he is anxious or upset. At times he may also substitute or use wrong words but this is quite infrequent. He is tolerating Xarelto quite well without significant bleeding or bruising and takes it with his dinner. He had a stress since then 2 weeks ago which went well. He plans to have crown replaced on one of his teeth by his dentist who told him that he could do it without stopping Xarelto. He had lipid profile checked 2 weeks ago by his primary physician and it was fine though I do not have those results. He saw his cardiologist recently who advised him to lose about 5 pounds. He has no other complaints today. ROS:  14 system review of systems is positive for mild speech hesitancy, anxeity and all the systems negative.  ALLERGIES: No Known Allergies  HOME MEDICATIONS: Outpatient Prescriptions Prior to Visit  Medication Sig Dispense Refill  . atorvastatin (LIPITOR) 20 MG tablet Take 20 mg by mouth daily.    . metoprolol (LOPRESSOR) 50 MG tablet Take 50 mg by mouth 2 (two) times daily.    . rivaroxaban (XARELTO) 20  MG TABS tablet Take 1 tablet (20 mg total) by mouth daily. 30 tablet 12   No facility-administered medications prior to visit.    PHYSICAL EXAM Filed Vitals:   02/09/15 1410  BP: 133/92  Pulse: 59  Height: 6\' 3"  (1.905 m)  Weight: 246 lb (111.585 kg)   Body mass index is 30.75 kg/(m^2). No exam data present No flowsheet data found.  Physical Exam  General: well developed, well nourished, middle aged male seated, in no evident distress    Head: head normocephalic and atraumatic. Orohparynx benign  Neck: supple with no carotid or supraclavicular bruits  Cardiovascular: Irregular rate and rhythm, no murmurs  Musculoskeletal: no deformity  Skin: mild nummular macular rash on back Vascular: Normal pulses all extremities   Neurologic Exam  Mental Status: Awake and fully alert. Oriented to place and time. Recent and remote memory intact. Attention span, concentration and fund of knowledge appropriate. Mood and affect appropriate. Mild hesitant speech with some word finding difficulties  Cranial Nerves: Fundoscopic exam not done. Pupils equal, briskly reactive to light. Extraocular movements full without nystagmus. Visual fields full to confrontation. Hearing intact. Facial sensation intact. Face, tongue, palate moves normally and symmetrically.  Motor: Normal bulk and tone. Normal strength in all tested extremity muscles.  Sensory: intact to touch and pinprick and vibratory sensation.  Coordination: Rapid alternating movements normal in all extremities. Finger-to-nose and heel-to-shin performed accurately bilaterally.  Gait and Station: Arises from chair without difficulty. Stance is normal. Gait demonstrates normal stride length and balance . Able to heel, toe and tandem walk without difficulty.  Reflexes: 1+ and symmetric. Toes downgoing.   ASSESSMENT: 69 year old male with left frontoparietal embolic infarct secondary to atrial fibrillation in December 2014 treated with IV TPA.  Residual mild hesitant speech with some word finding difficulties.  PLAN:  II had a long d/w patient about his remote stroke, risk for recurrent stroke/TIAs, personally independently reviewed imaging studies and stroke evaluation results and answered questions.Continue Xarelto  for secondary stroke prevention due to atrial fibrillation and maintain strict control of hypertension with blood pressure goal below 130/90, and lipids with LDL cholesterol goal  below 100 mg/dL. I also advised the patient to eat a healthy diet with plenty of whole grains, cereals, fruits and vegetables, exercise regularly and maintain ideal body weight Followup in the future with me in  6 months.                      Return in about 6 months (around 08/10/2015).  Antony Contras, MD  02/10/2015, 12:10 PM [name] [address], [place], [place] [address] [phone]  Note: This document was prepared with digital dictation and possible smart phrase technology. Any transcriptional errors that result from this process are unintentional.

## 2015-03-26 ENCOUNTER — Encounter: Payer: Self-pay | Admitting: General Surgery

## 2015-03-27 ENCOUNTER — Encounter: Payer: Self-pay | Admitting: General Surgery

## 2015-03-27 ENCOUNTER — Ambulatory Visit (INDEPENDENT_AMBULATORY_CARE_PROVIDER_SITE_OTHER): Payer: Medicare Other | Admitting: General Surgery

## 2015-03-27 VITALS — BP 120/76 | HR 74 | Resp 12 | Ht 76.0 in | Wt 242.0 lb

## 2015-03-27 DIAGNOSIS — Z1211 Encounter for screening for malignant neoplasm of colon: Secondary | ICD-10-CM | POA: Diagnosis not present

## 2015-03-27 DIAGNOSIS — Z86010 Personal history of colonic polyps: Secondary | ICD-10-CM | POA: Diagnosis not present

## 2015-03-27 NOTE — Progress Notes (Addendum)
Patient ID: Devon Lam, male   DOB: Nov 12, 1946, 69 y.o.   MRN: [id number]  Chief Complaint  Patient presents with  . Other    colonoscopy    HPI Devon Lam is a 69 y.o. male here for a colonoscopy consultation.  Last colonoscopy was done 03/08/2010. At that time a tubular adenoma was identified in the proximal transverse colon . Patient states he is doing well with no GI problems at this time.  HPI  Past Medical History  Diagnosis Date  . A-fib   . Anxiety   . Stroke 12/03/2013  . Diabetes mellitus without complication     Diet-controlled    Past Surgical History  Procedure Laterality Date  . Colonoscopy  03/08/10  . Cholecystectomy  2001    Acute and chronic cholecystitis and cholelithiasis.    Family History  Problem Relation Age of Onset  . Stroke Mother   . Diabetes Brother     Social History History  Substance Use Topics  . Smoking status: Never Smoker   . Smokeless tobacco: Never Used  . Alcohol Use: No    No Known Allergies  Current Outpatient Prescriptions  Medication Sig Dispense Refill  . atorvastatin (LIPITOR) 20 MG tablet Take 20 mg by mouth daily.    Marland Kitchen azelastine (ASTELIN) 0.1 % nasal spray Place 1 spray into the nose 2 (two) times daily.    . metoprolol (LOPRESSOR) 50 MG tablet Take 50 mg by mouth 2 (two) times daily.    . rivaroxaban (XARELTO) 20 MG TABS tablet Take 1 tablet (20 mg total) by mouth daily. 30 tablet 12  . polyethylene glycol powder (GLYCOLAX/MIRALAX) powder 255 grams one bottle for colonoscopy prep 255 g 0   No current facility-administered medications for this visit.    Review of Systems Review of Systems  Blood pressure 120/76, pulse 74, resp. rate 12, height 6\' 4"  (1.93 m), weight 242 lb (109.77 kg).  Physical Exam Physical Exam  Constitutional: He is oriented to person, place, and time. He appears well-developed and well-nourished.  Neck: Neck supple.  Cardiovascular: Normal rate, regular rhythm and normal  heart sounds.   Pulmonary/Chest: Effort normal and breath sounds normal.  Neurological: He is alert and oriented to person, place, and time.  Skin: Skin is warm and dry.    Data Reviewed 2011 colonoscopy and pathology report.  CBC at his primary care office 01/22/2015 was unremarkable. Last recorded blood sugar 154 (sees nonfasting).  Assessment    Candidate for follow-up colonoscopy.    Plan    Discussed colonoscopy. Based on his previous pathology, repeat exam is indicated. The risks associated with stroke off anticoagulation therapy was reviewed with the patient and his wife. As his cerebrovascular event is over 1 year ago, and with the use of Xarelto, his total time off medication should be about 4 days. Risks of colonoscopy including bleeding and perforation were reviewed.    Patient has been scheduled for a colonoscopy on 04-11-15 at [facility]. This patient will hold Xarelto 3 days prior to procedure (last dose Sunday, 04-08-15).  PCP:  Irish Elders 03/28/2015, 9:14 AM

## 2015-03-27 NOTE — Patient Instructions (Addendum)
Colonoscopy A colonoscopy is an exam to look at the entire large intestine (colon). This exam can help find problems such as tumors, polyps, inflammation, and areas of bleeding. The exam takes about 1 hour.  LET [facility] CARE PROVIDER KNOW ABOUT:   Any allergies you have.  All medicines you are taking, including vitamins, herbs, eye drops, creams, and over-the-counter medicines.  Previous problems you or members of your family have had with the use of anesthetics.  Any blood disorders you have.  Previous surgeries you have had.  Medical conditions you have. RISKS AND COMPLICATIONS  Generally, this is a safe procedure. However, as with any procedure, complications can occur. Possible complications include:  Bleeding.  Tearing or rupture of the colon wall.  Reaction to medicines given during the exam.  Infection (rare). BEFORE THE PROCEDURE   Ask your health care provider about changing or stopping your regular medicines.  You may be prescribed an oral bowel prep. This involves drinking a large amount of medicated liquid, starting the day before your procedure. The liquid will cause you to have multiple loose stools until your stool is almost clear or light green. This cleans out your colon in preparation for the procedure.  Do not eat or drink anything else once you have started the bowel prep, unless your health care provider tells you it is safe to do so.  Arrange for someone to drive you home after the procedure. PROCEDURE   You will be given medicine to help you relax (sedative).  You will lie on your side with your knees bent.  A long, flexible tube with a light and camera on the end (colonoscope) will be inserted through the rectum and into the colon. The camera sends video back to a computer screen as it moves through the colon. The colonoscope also releases carbon dioxide gas to inflate the colon. This helps your health care provider see the area better.  During  the exam, your health care provider may take a small tissue sample (biopsy) to be examined under a microscope if any abnormalities are found.  The exam is finished when the entire colon has been viewed. AFTER THE PROCEDURE   Do not drive for 24 hours after the exam.  You may have a small amount of blood in your stool.  You may pass moderate amounts of gas and have mild abdominal cramping or bloating. This is caused by the gas used to inflate your colon during the exam.  Ask when your test results will be ready and how you will get your results. Make sure you get your test results. Document Released: 12/05/2000 Document Revised: 09/28/2013 Document Reviewed: 08/15/2013 [facility] Patient Information 2015 [place], [place]. This information is not intended to replace advice given to you by your health care provider. Make sure you discuss any questions you have with your health care provider.  Patient has been scheduled for a colonoscopy on 04-11-15 at [facility]. This patient will hold Xarelto 3 days prior to procedure (last dose Sunday, 04-08-15).

## 2015-03-28 ENCOUNTER — Other Ambulatory Visit: Payer: Self-pay | Admitting: General Surgery

## 2015-03-28 ENCOUNTER — Encounter: Payer: Self-pay | Admitting: General Surgery

## 2015-03-28 DIAGNOSIS — Z86010 Personal history of colonic polyps: Secondary | ICD-10-CM | POA: Insufficient documentation

## 2015-03-28 DIAGNOSIS — Z1211 Encounter for screening for malignant neoplasm of colon: Secondary | ICD-10-CM

## 2015-03-28 MED ORDER — POLYETHYLENE GLYCOL 3350 17 GM/SCOOP PO POWD: ORAL | Status: DC

## 2015-04-11 ENCOUNTER — Ambulatory Visit
Admit: 2015-04-11 | Disposition: A | Discharge status: Home / Self Care | Payer: Self-pay | Attending: General Surgery | Admitting: General Surgery

## 2015-04-11 DIAGNOSIS — D123 Benign neoplasm of transverse colon: Secondary | ICD-10-CM | POA: Diagnosis not present

## 2015-04-12 ENCOUNTER — Encounter: Payer: Self-pay | Admitting: General Surgery

## 2015-04-13 ENCOUNTER — Telehealth: Payer: Self-pay

## 2015-04-13 ENCOUNTER — Encounter: Payer: Self-pay | Admitting: General Surgery

## 2015-04-13 NOTE — Telephone Encounter (Signed)
-----   Message from Robert Bellow, MD sent at 04/13/2015  9:14 AM EDT ----- Please notify the patient that the polyp removed at the time of Wednesdays colonoscopy was benign. He does get a chance to do this again in 5 years. Please put him in recalls. Thank you ----- Message -----    From: Augustin Schooling, CMA    Sent: 04/13/2015   8:34 AM      To: Robert Bellow, MD

## 2015-04-13 NOTE — Telephone Encounter (Signed)
Notified patient as instructed, patient pleased. Discussed follow-up appointments, patient agrees. Patient placed in recalls.   

## 2015-04-13 NOTE — Consult Note (Signed)
Referring Physician:  Lisa Roca :   Reason for Consult: Admit Date: 03-Dec-2013  Chief Complaint: Inability to speak  Reason for Consult: CVA   History of Present Illness: History of Present Illness:   Devon Lam is a 69 yo man with PMH significant for atrial fibrillation who presents to [facility] for evaluation of aphasia. He was last seen well around 1930 today while having dinner with his children. His wife arrived home at that time and noticed that his face seemed "drawn up" and he was not speaking. She did not notice any lateralized weakness and feels that the abnormal appearance of his face was symmetric. He presented to [facility], and en route had some nonfluent, nonsensical spontaneous speech, but did not respond to commands or questions. His initial evaluation in the ED included a noncontrast head CT that did not show evidence of intracranial hemorrhage or mature ischemic infarct.   ROS:  Review of Systems   Unable to obtain full ROS due to the patient's inability to communicate. Per wife, he had been feeling well and had no specific complaints in the days and hours leading up to his sudden decline.  Past Medical/Surgical Hx:  Atrial Fibrillation:   cholecystectomy:   Past Medical/ Surgical Hx:  Past Medical History Atrial fibrillation (treated with ASA 325 only), HTN   Past Surgical History cholecystectomy   Home Medications: Medication Instructions Last Modified Date/Time  metoprolol 50 mg oral tablet 1 tab(s) orally 2 times a day 13-Dec-14 21:35  aspirin 325 mg oral tablet 1 tab(s) orally once a day (at bedtime) 13-Dec-14 21:35   Allergies:  Allergies NKDA   Social/Family History: Lives With: spouse  Social History: Does not smoke, drink EtOH, or use illicit drugs.  Family History: Mother has DM and suffered a large stroke in her late 31s, leaving her hemiplegic.  Father died of ruptured AAA   EXAM: Well-developed, well-nourished, anxious-appearing. No conjunctival  injection or scleral edema. Oropharynx notable for poor dentition. No carotid bruits auscultated. Normal S1, S2 and irregular cardiac rhythm on exam. Lungs clear to auscultation bilaterally. Abdomen soft and nontender. Peripheral pulses palpated. No clubbing, cyanosis, or edema in extremities.  MENTAL STATUS: Alert and attentive to surroundings. Does not answer questions. Follows verbal commands to close eyes but does not follow any other spoken commands. He participates in the exam fairly well through mimicry. He does not name or repeat. He does have some halting brief utterances of spontaneous speech that are nonsensical. CRANIAL NERVES: Visual fields full to blink to threat. PERRL. EOMI. Facial muscles full and symmetric. Tongue midline MOTOR: Normal bulk and tone. Strength 5/5 in deltoids, biceps, triceps, wrist flexors and extensors, and hand intrinsics bilaterally. Strength 5/5 in iliopsoas, glutes, hamstrings, quads, and tib ant bilaterally. No drift. REFLEXES: 2+ in biceps, triceps, patella, and achilles bilaterally. Flexor plantar responses bilaterally. SENSORY: Intact to pinprick throughout COORDINATION: No ataxia or dysmetria on finger-nose or heel-shin maneuvers. GAIT: Not tested  NIH STROKE SCALE: 5 LOC: 0 Month/Age: 25 Two Commands: 1 Best Gaze: 0 Visual Fields: 0 Facial Weakness: 0 Left Arm: 0 Right Arm: 0 Left Leg: 0 Right Leg: 0 Ataxia: 0 Sensory: 0 Language: 2 Dysarthria: 0 Extinction: 0.  Lab Results: Hepatic:  13-Dec-14 21:30   Bilirubin, Total  1.1  Alkaline Phosphatase 92 (45-117 NOTE: New Reference Range 11/11/13)  SGPT (ALT) 27  SGOT (AST) 29  Total Protein, Serum 7.2  Albumin, Serum 3.6  Routine Chem:  13-Dec-14 21:30   Glucose, Serum  140  BUN 10  Creatinine (comp) 0.94  Sodium, Serum 140  Potassium, Serum 3.7  Chloride, Serum 107  CO2, Serum 28  Calcium (Total), Serum 9.1  Osmolality (calc) 281  eGFR (African American) >60  eGFR (Non-African  American) >60 (eGFR values <68m/min/1.73 m2 may be an indication of chronic kidney disease (CKD). Calculated eGFR is useful in patients with stable renal function. The eGFR calculation will not be reliable in acutely ill patients when serum creatinine is changing rapidly. It is not useful in  patients on dialysis. The eGFR calculation may not be applicable to patients at the low and high extremes of body sizes, pregnant women, and vegetarians.)  Anion Gap  5  Cardiac:  13-Dec-14 21:30   Troponin I < 0.02 (0.00-0.05 0.05 ng/mL or less: NEGATIVE  Repeat testing in 3-6 hrs  if clinically indicated. >0.05 ng/mL: POTENTIAL  MYOCARDIAL INJURY. Repeat  testing in 3-6 hrs if  clinically indicated. NOTE: An increase or decrease  of 30% or more on serial  testing suggests a  clinically important change)  Routine Coag:  13-Dec-14 21:30   Prothrombin 12.9  INR 1.0 (INR reference interval applies to patients on anticoagulant therapy. A single INR therapeutic range for coumarins is not optimal for all indications; however, the suggested range for most indications is 2.0 - 3.0. Exceptions to the INR Reference Range may include: Prosthetic heart valves, acute myocardial infarction, prevention of myocardial infarction, and combinations of aspirin and anticoagulant. The need for a higher or lower target INR must be assessed individually. Reference: The Pharmacology and Management of the Vitamin K  antagonists: the seventh ACCP Conference on Antithrombotic and Thrombolytic Therapy. CSWHQP.5916Sept:126 (3suppl): [id number] A HCT value >55% may artifactually increase the PT.  In one study,  the increase was an average of 25%. Reference:  "Effect on Routine and Special Coagulation Testing Values of Citrate Anticoagulant Adjustment in Patients with High HCT Values." American Journal of Clinical Pathology 2006;126:400-405.)  Activated PTT (APTT) 30.3 (A HCT value >55% may artifactually increase  the APTT. In one study, the increase was an average of 19%. Reference: "Effect on Routine and Special Coagulation Testing Values of Citrate Anticoagulant Adjustment in Patients with High HCT Values." American Journal of Clinical Pathology 2006;126:400-405.)  Routine Hem:  13-Dec-14 21:30   WBC (CBC) 6.9  RBC (CBC) 5.39  Hemoglobin (CBC) 15.8  Hematocrit (CBC) 47.2  Platelet Count (CBC) 184  MCV 88  MCH 29.4  MCHC 33.6  RDW 14.3  Neutrophil % 60.5  Lymphocyte % 23.7  Monocyte % 10.3  Eosinophil % 4.1  Basophil % 1.4  Neutrophil # 4.2  Lymphocyte # 1.6  Monocyte # 0.7  Eosinophil # 0.3  Basophil # 0.1 (Result(s) reported on 03 Dec 2013 at 09:51PM.)   Radiology Results: CT:    13-Dec-14 21:41, CT Head Without Contrast  CT Head Without Contrast   REASON FOR EXAM:    CVA S/S FOR APPROX 1HR  RM 6  COMMENTS:   May transport without cardiac monitor    PROCEDURE: CT  - CT HEAD WITHOUT CONTRAST  - Dec 03 2013  9:41PM     CLINICAL DATA:  Stroke symptoms    EXAM:  CT HEAD WITHOUT CONTRAST    TECHNIQUE:  Contiguous axial images were obtained from the base of the skull  through the vertex without intravenous contrast.    COMPARISON:  None.  FINDINGS:  There is no acute intracranial hemorrhage or large vessel territory  infarct. Left  cerebellum is somewhat hypoplastic with either  arachnoid cyst or mega cisterna magna present within the posterior  fossa. No mass lesion or midline shift. Gray-white matter  differentiation is well maintained. Ventricles are normal in size  without evidence of hydrocephalus. CSF containing spaces are within  normal limits. No extra-axial fluid collection.    The calvarium is intact.    Orbital soft tissues are within normal limits.    The paranasal sinuses and mastoid air cells are well pneumatized and  free offluid.  Scalp soft tissues are unremarkable.     IMPRESSION:  No acute large vessel territory infarct or other  intracranial  process identified. If there is continued clinical concern for  possible acute ischemic insult, further evaluation with MRI could be  performed.      Electronically Signed    By: Jeannine Boga M.D.    On: 12/03/2013 21:57         Verified By: Neomia Glass, M.D.,   Radiology Impression: Radiology Impression: CT HEAD WITHOUT CONTRAST    IMPRESSION:  No acute large vessel territory infarct or other intracranial  process identified. If there is continued clinical concern for  possible acute ischemic insult, further evaluation with MRI could be  performed.   Impression/Recommendations: Recommendations:   Devon Lam is a 69 yo man with a history of atrial fibrillation, not anticoagulated, who presented with sudden onset global aphasia around 1930 this evening. 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 He presented within the therapeutic window for IV-rtPA, did not have evidence of territoral infarct or bleeding on his head CT, and did not have any contraindications for tPA. The risks and benefits were explained to his wife, who gave consent. I recommended that tPA be given as his NIHSS was 5 and his aphasia was particularly severe and potentially disabling.  etiology of his stroke is likely cardioembolic due to atrial fibrillation, which the cardiac monitor currently confirms. I began the discussion of the strong indication for anticoagulation in the future for secondary stroke risk reduction. patient will be transferred to [facility] for further evaluation and close neuro monitoring after receiving tPA.   Electronic Signatures: Carmin Richmond (MD)  (Signed 13-Dec-14 23:12)  Authored: REFERRING PHYSICIAN, Consult, History of Present Illness, Review of Systems, PAST MEDICAL/SURGICAL HISTORY, HOME  MEDICATIONS, ALLERGIES, Social/Family History, Physical Exam-, LAB RESULTS, RADIOLOGY RESULTS, Recommendations   Last Updated: 13-Dec-14 23:12 by Carmin Richmond (MD)

## 2015-04-16 LAB — SURGICAL PATHOLOGY

## 2015-08-02 ENCOUNTER — Encounter: Payer: Self-pay | Admitting: Nurse Practitioner

## 2015-08-02 ENCOUNTER — Ambulatory Visit (INDEPENDENT_AMBULATORY_CARE_PROVIDER_SITE_OTHER): Payer: Medicare Other | Admitting: Nurse Practitioner

## 2015-08-02 VITALS — BP 135/84 | HR 74 | Ht 76.0 in | Wt 242.5 lb

## 2015-08-02 DIAGNOSIS — I6340 Cerebral infarction due to embolism of unspecified cerebral artery: Secondary | ICD-10-CM

## 2015-08-02 DIAGNOSIS — I10 Essential (primary) hypertension: Secondary | ICD-10-CM

## 2015-08-02 DIAGNOSIS — I482 Chronic atrial fibrillation, unspecified: Secondary | ICD-10-CM

## 2015-08-02 MED ORDER — RIVAROXABAN 20 MG PO TABS: 20.0000 mg | ORAL_TABLET | Freq: Every day | ORAL | Status: DC

## 2015-08-02 NOTE — Progress Notes (Signed)
I have reviewed and agreed above plan. 

## 2015-08-02 NOTE — Patient Instructions (Signed)
Continue Xarelto for secondary stroke prevention and history of atrial fibrillation will refill please ask cardiologist or PCP to continue filling Strict control of cholesterol with LDL less than 100 Strict control of blood pressure with systolic less than 330/07, today's reading 135/84 Continue to exercise by walking for overall health and well-being For recurrent stroke symptoms called 911 and proceed to the hospital No follow-up planned stable from a neuro standpoint

## 2015-08-02 NOTE — Progress Notes (Signed)
[name]  PATIENT: Devon Lam DOB: September 04, 1946   REASON FOR VISIT: History of stroke December 2014, atrial fibrillation HISTORY FROM: Patient and wife    HISTORY OF PRESENT ILLNESS:69 year male seen for first office followup visit for hospital admission for stroke in December 2014. He developed sudden onset of confusion and aphasia at home and was brought to the hospital in a timely fashion and administered IV t-PA uneventfully. CT angiogram did not reveal any large vessel occlusion. He was admitted to the neuro ICU and remained stable and blood pressure was tightly controlled. MRI scan confirmed ischemic infarct and left frontal lobes with slight petechial transformation which was not felt to be significant. He had no history of atrial fibrillation. He was started on Xarelto for second stroke prevention. Hemoglobin A1c was borderline at 6.2. Lipid profile significant only for borderline LDL of 104 patient says aphasia improved but he still had some expressive language difficulties at the time of discharge. He was discharged home and hospital and outpatient physical occupational and speech therapy and has done well. History of dislocation confusion and has increased reaction time and his speech is hesitant. When he is excited, exhausted, or tired his speech is more hesitant. Patient's feels his gait and strength is good. He feels he is not depressed. 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  UPDATE 02/09/2015 : He returns for follow-up after last visit 6 months ago. He continues to have occasional speech hesitancy particularly when he is anxious or upset. At times he may also substitute or use wrong words but this is quite infrequent. He is tolerating Xarelto quite well without significant bleeding or bruising and takes it with his dinner.  He had lipid profile checked 2 weeks ago by his  primary physician and it was fine though I do not have those results. He saw his cardiologist recently who advised him to lose about 5 pounds. He has no other complaints today. UPDATE 08/02/2015 [name], 69 year old male returns for follow-up for stroke in December 2014. He developed sudden onset of confusion and aphasia at home and was brought to the hospital in a timely fashion and administered IV t-PA uneventfully. He has not had further stroke or TIA symptoms since that time. He is currently on Xarelto. He has minimal bruising. His lipid profile is followed by his primary care and he is currently on Lipitor without muscle aches or other side effects. His blood pressure is well controlled. 135/84 in the  office. He does exercise by walking. He returns for reevaluation  REVIEW OF SYSTEMS: Full 14 system review of systems performed and notable only for those listed, all others are neg:  Constitutional: neg  Cardiovascular: neg Ear/Nose/Throat: neg  Skin: neg Eyes: neg Respiratory: neg Gastroitestinal: neg  Hematology/Lymphatic: neg  Endocrine: neg Musculoskeletal:neg Allergy/Immunology: neg Neurological: neg Psychiatric: neg Sleep : neg   ALLERGIES: No Known Allergies  HOME MEDICATIONS: Outpatient Prescriptions Prior to Visit  Medication Sig Dispense Refill  . atorvastatin (LIPITOR) 20 MG tablet Take 20 mg by mouth daily.    . metoprolol (LOPRESSOR) 50 MG tablet Take 50 mg by mouth 2 (two) times daily.    . rivaroxaban (XARELTO) 20 MG TABS tablet Take 1 tablet (20 mg total) by mouth daily. 30 tablet 12  . azelastine (ASTELIN) 0.1 % nasal spray Place 1 spray into the nose 2 (two) times daily.    Marland Kitchen  polyethylene glycol powder (GLYCOLAX/MIRALAX) powder 255 grams one bottle for colonoscopy prep (Patient not taking: Reported on 08/02/2015) 255 g 0   No facility-administered medications prior to visit.    PAST MEDICAL HISTORY: Past Medical History  Diagnosis Date  . A-fib   .  Anxiety   . Stroke 12/03/2013  . Diabetes mellitus without complication     Diet-controlled    PAST SURGICAL HISTORY: Past Surgical History  Procedure Laterality Date  . Colonoscopy  03/08/10  . Cholecystectomy  2001    Acute and chronic cholecystitis and cholelithiasis.    FAMILY HISTORY: Family History  Problem Relation Age of Onset  . Stroke Mother   . Diabetes Brother     SOCIAL HISTORY: Social History   Social History  . Marital Status: Married    Spouse Name: vickie  . Number of Children: 2  . Years of Education: college   Occupational History  . retired    Social History Main Topics  . Smoking status: Never Smoker   . Smokeless tobacco: Never Used  . Alcohol Use: No  . Drug Use: No  . Sexual Activity: Not on file   Other Topics Concern  . Not on file   Social History Narrative   Patient lives at home with his wife    Patient drinks pepsi     PHYSICAL EXAM  Filed Vitals:   08/02/15 1426  BP: 135/84  Pulse: 74  Height: 6\' 4"  (1.93 m)  Weight: 242 lb 8 oz (109.997 kg)   Body mass index is 29.53 kg/(m^2). General: well developed, well nourished, middle aged male seated, in no evident distress  Head: head normocephalic and atraumatic. Orohparynx benign  Neck: supple with no carotid or supraclavicular bruits  Cardiovascular: Irregular rate and rhythm, no murmurs  Musculoskeletal: no deformity  Skin: mild bruising Vascular: Normal pulses all extremities   Neurologic Exam  Mental Status: Awake and fully alert. Oriented to place and time. Recent and remote memory intact. Attention span, concentration and fund of knowledge appropriate. Mood and affect appropriate.  Cranial Nerves: Fundoscopic exam not done. Pupils equal, briskly reactive to light. Extraocular movements full without nystagmus. Visual fields full to confrontation. Hearing intact. Facial sensation intact. Face, tongue, palate moves normally and symmetrically.  Motor: Normal bulk  and tone. Normal strength in all tested extremity muscles.  Sensory: intact to touch and pinprick and vibratory sensation.  Coordination: Rapid alternating movements normal in all extremities. Finger-to-nose and heel-to-shin performed accurately bilaterally.  Gait and Station: Arises from chair without difficulty. Stance is normal. Gait demonstrates normal stride length and balance . Able to heel, toe and tandem walk without difficulty.  Reflexes: 1+ and symmetric. Toes downgoing.  DIAGNOSTIC DATA (LABS, IMAGING, TESTING) - I reviewed patient records, labs, notes, testing and imaging myself where available.  Lab Results  Component Value Date   WBC 7.5 12/08/2014   HGB 15.6 12/08/2014   HCT 46.2 12/08/2014   MCV 89 12/08/2014   PLT 192 12/08/2014      Component Value Date/Time   NA 140 12/08/2014 1520   NA 140 12/04/2013 0526   K 4.3 12/08/2014 1520   K 3.9 12/04/2013 0526   CL 108* 12/08/2014 1520   CL 107 12/04/2013 0526   CO2 24 12/08/2014 1520   CO2 23 12/04/2013 0526   GLUCOSE 131* 12/08/2014 1520   GLUCOSE 122* 12/04/2013 0526   BUN 12 12/08/2014 1520   BUN 10 12/04/2013 0526   CREATININE 0.95 12/08/2014 1520  CREATININE 0.78 12/04/2013 0526   CALCIUM 9.7 12/08/2014 1520   CALCIUM 8.6 12/04/2013 0526   PROT 7.1 12/08/2014 1520   ALBUMIN 3.8 12/08/2014 1520   AST 38* 12/08/2014 1520   ALT 52 12/08/2014 1520   ALKPHOS 88 12/08/2014 1520   BILITOT 1.6* 12/08/2014 1520   GFRNONAA >90 12/04/2013 0526   GFRNONAA >60 12/03/2013 2130   GFRAA >90 12/04/2013 0526   GFRAA >60 12/03/2013 2130     ASSESSMENT AND PLAN  69 y.o. year old male  has a past medical history of A-fib; Anxiety; Stroke (12/03/2013); and Diabetes mellitus without complication. here to follow-up. He has not had further stroke or TIA symptoms since December 2014.The patient is a current patient of Dr. Leonie Man who is out of the office today . This note is sent to the work in doctor.     Continue  Xarelto for secondary stroke prevention and history of atrial fibrillation will refill please ask cardiologist or PCP to continue filling Strict control of cholesterol with LDL less than 100 Strict control of blood pressure with systolic less than 110/31, today's reading 135/84 Continue to exercise by walking for overall health and well-being For recurrent stroke symptoms called 911 and proceed to the hospital No follow-up planned stable from a neuro standpoint Dennie Bible, [facility], [facility], [facility] [address], [place], [place] [address] [phone]

## 2015-08-07 ENCOUNTER — Ambulatory Visit: Payer: Medicare Other | Admitting: Neurology

## 2017-05-07 ENCOUNTER — Encounter: Payer: Medicare Other | Attending: Internal Medicine | Admitting: *Deleted

## 2017-05-07 ENCOUNTER — Encounter: Payer: Self-pay | Admitting: *Deleted

## 2017-05-07 VITALS — BP 140/86 | Ht 76.0 in | Wt 247.7 lb

## 2017-05-07 DIAGNOSIS — E119 Type 2 diabetes mellitus without complications: Secondary | ICD-10-CM

## 2017-05-07 DIAGNOSIS — E1122 Type 2 diabetes mellitus with diabetic chronic kidney disease: Secondary | ICD-10-CM | POA: Diagnosis not present

## 2017-05-07 DIAGNOSIS — N182 Chronic kidney disease, stage 2 (mild): Secondary | ICD-10-CM | POA: Insufficient documentation

## 2017-05-07 DIAGNOSIS — Z713 Dietary counseling and surveillance: Secondary | ICD-10-CM | POA: Insufficient documentation

## 2017-05-07 NOTE — Progress Notes (Signed)
Diabetes Self-Management Education  Visit Type: First/Initial  Appt. Start Time: 1110 Appt. End Time: 1225  05/07/2017  [name], identified by name and date of birth, is a 71 y.o. male with a diagnosis of Diabetes: Type 2.   ASSESSMENT  Blood pressure 140/86, height 6\' 4"  (1.93 m), weight 247 lb 11.2 oz (112.4 kg). Body mass index is 30.15 kg/m.      Diabetes Self-Management Education - 05/07/17 1237      Visit Information   Visit Type First/Initial     Initial Visit   Diabetes Type Type 2   Are you currently following a meal plan? No   Are you taking your medications as prescribed? Yes   Date Diagnosed "several weeks ago" - A1C in 2016 was already 6.7%     Health Coping   How would you rate your overall health? Fair     Psychosocial Assessment   Patient Belief/Attitude about Diabetes Motivated to manage diabetes   Self-care barriers None   Self-management support Doctor's office;Family   Other persons present Spouse/SO   Patient Concerns Nutrition/Meal planning;Healthy Lifestyle   Special Needs None   Preferred Learning Style Auditory   Learning Readiness Contemplating   How often do you need to have someone help you when you read instructions, pamphlets, or other written materials from your doctor or pharmacy? 3 - Sometimes  since pt had stroke   What is the last grade level you completed in school? 2 years [employer]     Pre-Education Assessment   Patient understands the diabetes disease and treatment process. Needs Instruction   Patient understands incorporating nutritional management into lifestyle. Needs Instruction   Patient undertands incorporating physical activity into lifestyle. Needs Instruction   Patient understands using medications safely. Needs Instruction   Patient understands monitoring blood glucose, interpreting and using results Needs Instruction   Patient understands prevention, detection, and treatment of acute complications. Needs  Instruction   Patient understands prevention, detection, and treatment of chronic complications. Needs Instruction   Patient understands how to develop strategies to address psychosocial issues. Needs Instruction   Patient understands how to develop strategies to promote health/change behavior. Needs Instruction     Complications   Last HgB A1C per patient/outside source 8.8 %  03/31/17   How often do you check your blood sugar? 0 times/day (not testing)  Provided One Touch Verio Flex and instructed on use. BG upon return demonstration was 207 mg/dL at 12:10 pm - 2 hrs pp.  He had gravy biscuit and regular soda for breakfast.    Have you had a dilated eye exam in the past 12 months? No   Have you had a dental exam in the past 12 months? Yes   Are you checking your feet? No     Dietary Intake   Breakfast gravy biscuit or cheese toast or eggs and toast with bacon or sausage   Lunch yogurt or tomatoes and cheese or peanut butter/jelly sandwich   Snack (afternoon) fruit   Dinner salmon or chicken or beef with baked potatoe, green beans, corn, fries   Snack (evening) cheese puffs with ketchup   Beverage(s) water, Pepsi     Exercise   Exercise Type Light (walking / raking leaves)   How many days per week to you exercise? 2   How many minutes per day do you exercise? 30   Total minutes per week of exercise 60     Patient Education   Previous Diabetes Education No  Disease state  Definition of diabetes, type 1 and 2, and the diagnosis of diabetes   Nutrition management  Role of diet in the treatment of diabetes and the relationship between the three main macronutrients and blood glucose level;Reviewed blood glucose goals for pre and post meals and how to evaluate the patients' food intake on their blood glucose level.   Physical activity and exercise  Role of exercise on diabetes management, blood pressure control and cardiac health.   Medications Reviewed patients medication for diabetes,  action, purpose, timing of dose and side effects.   Monitoring Taught/evaluated SMBG meter.;Purpose and frequency of SMBG.;Taught/discussed recording of test results and interpretation of SMBG.;Identified appropriate SMBG and/or A1C goals.   Chronic complications Relationship between chronic complications and blood glucose control   Psychosocial adjustment Identified and addressed patients feelings and concerns about diabetes     Individualized Goals (developed by patient)   Reducing Risk Lead a healthier lifestyle     Outcomes   Expected Outcomes Demonstrated interest in learning. Expect positive outcomes      Individualized Plan for Diabetes Self-Management Training:   Learning Objective:  Patient will have a greater understanding of diabetes self-management. Patient education plan is to attend individual and/or group sessions per assessed needs and concerns.   Plan:   Patient Instructions  Check blood sugars 2 x day before breakfast and 2 hrs after supper every day Bring blood sugar records to the next class Call your doctor for a prescription for:  1. Meter strips (type) One Touch Verio checking  2 times per day  2. Lancets (type) One Touch Delica checking  2     times per day Exercise: Continue walking  for  30 minutes  2-3 days a week and gradually increase as tolerated Eat 3 meals day, 2  snacks a day Space meals 4-6 hours apart Avoid sugar sweetened drinks (soda) Make an eye doctor appointment   Expected Outcomes:  Demonstrated interest in learning. Expect positive outcomes  Education material provided:  General Meal Planning Guidelines Simple Meal Plan Meter - One Touch Verio Flex  If problems or questions, patient to contact team via:  Johny Drilling, [place], [place], CDE [phone]  Future DSME appointment:  May 14, 2017 for Diabetes Class 1

## 2017-05-07 NOTE — Patient Instructions (Signed)
Check blood sugars 2 x day before breakfast and 2 hrs after supper every day  Bring blood sugar records to the next class  Call your doctor for a prescription for:  1. Meter strips (type) One Touch Verio checking  2 times per day  2. Lancets (type) One Touch Delica checking  2     times per day  Exercise: Continue walking  for  30 minutes  2-3 days a week and gradually increase as tolerated  Eat 3 meals day, 2  snacks a day Space meals 4-6 hours apart Avoid sugar sweetened drinks (soda)  Make an eye doctor appointment  Return for classes on:

## 2017-05-14 ENCOUNTER — Encounter: Payer: Self-pay | Admitting: Dietician

## 2017-05-14 ENCOUNTER — Encounter: Payer: Medicare Other | Admitting: Dietician

## 2017-05-14 VITALS — Wt 248.6 lb

## 2017-05-14 DIAGNOSIS — E119 Type 2 diabetes mellitus without complications: Secondary | ICD-10-CM

## 2017-05-14 DIAGNOSIS — Z713 Dietary counseling and surveillance: Secondary | ICD-10-CM | POA: Diagnosis not present

## 2017-05-14 NOTE — Progress Notes (Signed)
Appt. Start Time: 9:00am Appt. End Time: 12:00 pm  Class 1 Diabetes Overview - define DM; state own type of DM; identify functions of pancreas and insulin; define insulin deficiency vs insulin resistance  Psychosocial - identify DM as a source of stress; state the effects of stress on BG control; verbalize appropriate stress management techniques; identify personal stress issues   Nutritional Management - describe effects of food on blood glucose; identify sources of carbohydrate, protein and fat; verbalize the importance of balance meals in controlling blood glucose; identify meals as well balanced or not; estimate servings of carbohydrate from menus; use food labels to identify servings size, content of carbohydrate, fiber, protein, fat, saturated fat and sodium; recognize food sources of fat, saturated fat, trans fat, sodium and verbalize goals for intake; describe healthful appropriate food choices when dining out   Exercise - describe the effects of exercise on blood glucose and importance of regular exercise in controlling diabetes; state a plan for personal exercise; verbalize contraindications for exercise  Self-Monitoring - state importance of HBGM and demo procedure accurately; use HBGM results to effectively manage diabetes; identify importance of regular HbA1C testing and goals for results  Acute Complications/Sick Day Guidelines - recognize hyperglycemia and hypoglycemia with causes and effects; identify blood glucose results as high, low or in control; list steps in treating and preventing high and low blood glucose; state appropriate measure to manage blood glucose when ill (need for meds, HBGM plan, when to call physician, need for fluids)  Chronic Complications/Foot, Skin, Eye Dental Care - identify possible long-term complications of diabetes (retinopathy, neuropathy, nephropathy, cardiovascular disease, infections); explain steps in prevention and treatment of chronic complications;  state importance of daily self-foot exams; describe how to examine feet and what to look for; explain appropriate eye and dental care  Lifestyle Changes/Goals & Health/Community Resources - state benefits of making appropriate lifestyle changes; identify habits that need to change (meals, tobacco, alcohol); identify strategies to reduce risk factors for personal health; set goals for proper diabetes care; state need for and frequency of healthcare follow-up; describe appropriate community resources for good health (ADA, web sites, apps)   Pregnancy/Sexual Health - define gestational diabetes; state importance of good blood glucose control and birth control prior to pregnancy; state importance of good blood glucose control in preventing sexual problems (impotence, vaginal dryness, infections, loss of desire); state relationship of blood glucose control and pregnancy outcome; describe risk of maternal and fetal complications  Teaching Materials Used: Class 1 Slides/Notebook Diabetes Booklet ID Card  Medic Alert/Medic ID Forms Sleep Evaluation Exercise Handout Daily Food Record Planning a Balanced Meal Goals for Class 1 

## 2017-05-21 ENCOUNTER — Encounter: Payer: Medicare Other | Admitting: *Deleted

## 2017-05-21 ENCOUNTER — Encounter: Payer: Self-pay | Admitting: *Deleted

## 2017-05-21 VITALS — Wt 242.4 lb

## 2017-05-21 DIAGNOSIS — E119 Type 2 diabetes mellitus without complications: Secondary | ICD-10-CM

## 2017-05-21 DIAGNOSIS — Z713 Dietary counseling and surveillance: Secondary | ICD-10-CM | POA: Diagnosis not present

## 2017-05-21 NOTE — Progress Notes (Signed)
Appt. Start Time: 0900 Appt. End Time: 1200  Class 2 Nutritional Management - identify sources of carbohydrate, protein and fat; plan balanced meals; estimate servings of carbohydrates in meals  Psychosocial - identify DM as a source of stress; state the effects of stress on BG control  Exercise - describe the effects of exercise on blood glucose and importance of regular exercise in controlling diabetes; state a plan for personal exercise; verbalize contraindications for exercise  Self-Monitoring - state importance of SMBG; use SMBG results to effectively manage diabetes; identify importance of regular HbA1C testing and goals for results  Acute Complications - recognize hyperglycemia and hypoglycemia with causes and effects; identify blood glucose results as high, low or in control; list steps in treating and preventing high and low blood glucose  Sick Day Guidelines: state appropriate measure to manage blood glucose when ill (need for meds, HBGM plan, when to call physician, need for fluids)  Chronic Complications/Foot, Skin, Eye Dental Care - identify possible long-term complications of diabetes (retinopathy, neuropathy, nephropathy, cardiovascular disease, infections); explain steps in prevention and treatment of chronic complications; state importance of daily self-foot exams; describe how to examine feet and what to look for; explain appropriate eye and dental care  Lifestyle Changes/Goals - state benefits of making appropriate lifestyle changes; identify habits that need to change (meals, tobacco, alcohol); identify strategies to reduce risk factors for personal health  Pregnancy/Sexual Health - state importance of good blood glucose control in preventing sexual problems (impotence, vaginal dryness, infections, loss of desire)  Teaching Materials Used: Class 2 Slide Packet A1C Pamphlet Foot Care Literature Kidney Test Handout Quick and "Balanced" Meal Ideas Carb Counting and Meal  Planning Book Goals for Class 2 

## 2017-05-28 ENCOUNTER — Encounter: Payer: Self-pay | Admitting: Dietician

## 2017-05-28 ENCOUNTER — Encounter: Payer: Medicare Other | Attending: Internal Medicine | Admitting: Dietician

## 2017-05-28 VITALS — BP 110/74 | Wt 236.1 lb

## 2017-05-28 DIAGNOSIS — E1122 Type 2 diabetes mellitus with diabetic chronic kidney disease: Secondary | ICD-10-CM | POA: Insufficient documentation

## 2017-05-28 DIAGNOSIS — Z713 Dietary counseling and surveillance: Secondary | ICD-10-CM | POA: Diagnosis not present

## 2017-05-28 DIAGNOSIS — N182 Chronic kidney disease, stage 2 (mild): Secondary | ICD-10-CM | POA: Insufficient documentation

## 2017-05-28 DIAGNOSIS — E119 Type 2 diabetes mellitus without complications: Secondary | ICD-10-CM

## 2017-05-28 NOTE — Progress Notes (Signed)

## 2017-06-09 ENCOUNTER — Encounter: Payer: Self-pay | Admitting: *Deleted

## 2019-07-21 ENCOUNTER — Encounter: Payer: Self-pay | Admitting: General Surgery

## 2020-02-28 ENCOUNTER — Telehealth: Payer: Self-pay

## 2020-02-28 NOTE — Telephone Encounter (Signed)
Call to patient as he is due for his Colonoscopy screening. He states that he will be going to Dr Bary Castilla for this.

## 2020-07-13 ENCOUNTER — Other Ambulatory Visit: Payer: Self-pay | Admitting: General Surgery

## 2020-07-13 DIAGNOSIS — Z86010 Personal history of colonic polyps: Secondary | ICD-10-CM

## 2020-07-13 NOTE — Progress Notes (Signed)
Patient ID: Devon Lam is a 74 y.o. male.  HPI  The following portions of the patient's history were reviewed and updated as appropriate.  This an established patient is here today for: office visit. Here today to discuss having a colonoscopy. Last completed in 2016. He admits his bowels move every other day, some hemorrhoids with bleeding. States stools are hard. He was seen by Dr Devon Lam and received a good check-up. He is here with his wife, Devon Lam.  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  He depends a great deal on his wife for names and dates.  He reports he is intermittently had bright red blood with hard bowel movements.  Review of Systems  Constitutional: Negative for chills and fever.  Respiratory: Negative for cough and chest tightness.   Gastrointestinal: Negative for constipation and diarrhea.        Chief Complaint  Patient presents with  . Pre-op Exam    colonoscopy     BP 128/66   Pulse 84   Temp 36.5 C (97.7 F)   Ht 193 cm (6\' 4" )   Wt 100.7 kg (222 lb)   SpO2 99%   BMI 27.02 kg/m       Past Medical History:  Diagnosis Date  . Atrial fibrillation (CMS-HCC)    with embolic stroke  . Hyperlipidemia   . Hypertension   . PVD (peripheral vascular disease) (CMS-HCC)    with slightly enlarged ascending aorta.           Past Surgical History:  Procedure Laterality Date  . CHOLECYSTECTOMY    . COLONOSCOPY  2013      Social History          Socioeconomic History  . Marital status: Married    Spouse name: Not on file  . Number of children: Not on file  . Years of education: Not on file  . Highest education level: Not on file  Occupational History  . Not on file  Tobacco Use  . Smoking status: Never Smoker  . Smokeless tobacco: Never Used  Substance and Sexual Activity  . Alcohol use: No  . Drug use: Not on file  . Sexual activity: Not on file  Other Topics Concern  . Not  on file  Social History Narrative   Married, 2 grown sons   Works at [employer]   No tobacco   Etoh none   Hobbies played softball    Social Determinants of Scientist, physiological Strain:   . Difficulty of Paying Living Expenses:   Food Insecurity:   . Worried About Charity fundraiser in the Last Year:   . Arboriculturist in the Last Year:   Transportation Needs:   . Film/video editor (Medical):   Marland Kitchen Lack of Transportation (Non-Medical):        No Known Allergies  Current Medications        Current Outpatient Medications  Medication Sig Dispense Refill  . atorvastatin (LIPITOR) 40 MG tablet TAKE 1 TABLET BY MOUTH EVERY DAY 90 tablet 3  . lisinopriL (ZESTRIL) 20 MG tablet Take 1 tablet (20 mg total) by mouth once daily 90 tablet 4  . LORazepam (ATIVAN) 0.5 MG tablet Take 1 tablet (0.5 mg total) by mouth every 12 (twelve) hours as needed 20 tablet 0  . metFORMIN (GLUCOPHAGE-XR) 500 MG XR tablet TAKE 2 TABLETS BY MOUTH DAILY WITH DINNER 180 tablet 1  .  metoprolol tartrate (LOPRESSOR) 50 MG tablet TAKE 1 TABLET BY MOUTH TWICE A DAY 180 tablet 1  . ONETOUCH DELICA LANCETS 30 gauge Misc USE 1 EACH 3 (THREE) TIMES DAILY USE AS INSTRUCTED.    Marland Kitchen ONETOUCH VERIO TEST STRIPS test strip USE 2 (TWO) TIMES DAILY. USE AS INSTRUCTED. ONE TOUCH VERIO DX. E11.22 200 strip 1  . XARELTO 20 mg tablet TAKE 1 TABLET BY MOUTH EVERY DAY 90 tablet 1  . lancets Use 1 each 2 (two) times daily. Use as instructed. One Touch Delica Dx T36.46 803 each 3   No current facility-administered medications for this visit.           Family History  Problem Relation Age of Onset  . COPD Father   . Cancer Other   . Stroke Mother   . Coronary Artery Disease (Blocked arteries around heart) Brother   . Diabetes type II Other   . High blood pressure (Hypertension) Other             Objective:   Physical Exam Constitutional:      Appearance: Normal appearance.  Skin:     General: Skin is warm and dry.  Neurological:     Mental Status: He is alert and oriented to person, place, and time.  Psychiatric:        Mood and Affect: Mood normal.        Behavior: Behavior normal.    Labs and Radiology:    Review of the April 11, 2015 showed a 10 mm polyp in the proximal transverse colon.    Assessment:     Candidate for follow-up colonoscopy.    Plan:      Indications for follow-up colonoscopy were reviewed.  Small risk of recurrent stroke off anticoagulants was discussed with the patient and his wife.  (Original stroke in 2013 was after the diagnosis of atrial fibrillation but before he was started on chronic anticoagulation.).  We will plan to hold his Xarelto x3 days as recommended by Dr. Nehemiah Lam in cardiology.  Risks associated with the procedure including bleeding and injury to the intestines were reviewed.  Entered by Karie Fetch, RN, acting as a scribe for Dr. Hervey Ard, MD.  The documentation recorded by the scribe accurately reflects the service I personally performed and the decisions made by me.   Devon Bellow, MD FACS

## 2020-07-30 ENCOUNTER — Other Ambulatory Visit
Admission: RE | Admit: 2020-07-30 | Discharge: 2020-07-30 | Disposition: A | Discharge status: Home / Self Care | Payer: Medicare Other | Source: Ambulatory Visit | Attending: General Surgery | Admitting: General Surgery

## 2020-07-30 ENCOUNTER — Other Ambulatory Visit: Payer: Self-pay

## 2020-07-30 DIAGNOSIS — Z01812 Encounter for preprocedural laboratory examination: Secondary | ICD-10-CM | POA: Diagnosis present

## 2020-07-30 DIAGNOSIS — Z20822 Contact with and (suspected) exposure to covid-19: Secondary | ICD-10-CM | POA: Diagnosis not present

## 2020-07-31 ENCOUNTER — Encounter: Payer: Self-pay | Admitting: General Surgery

## 2020-07-31 LAB — SARS CORONAVIRUS 2 (TAT 6-24 HRS): SARS Coronavirus 2: NEGATIVE

## 2020-08-01 ENCOUNTER — Encounter: Payer: Self-pay | Admitting: General Surgery

## 2020-08-01 ENCOUNTER — Ambulatory Visit: Payer: Medicare Other | Admitting: Certified Registered Nurse Anesthetist

## 2020-08-01 ENCOUNTER — Encounter
Admission: RE | Disposition: A | Discharge status: Home / Self Care | Payer: Self-pay | Source: Home / Self Care | Attending: General Surgery

## 2020-08-01 ENCOUNTER — Ambulatory Visit
Admission: RE | Admit: 2020-08-01 | Discharge: 2020-08-01 | Disposition: A | Discharge status: Home / Self Care | Payer: Medicare Other | Attending: General Surgery | Admitting: General Surgery

## 2020-08-01 DIAGNOSIS — Z7984 Long term (current) use of oral hypoglycemic drugs: Secondary | ICD-10-CM | POA: Diagnosis not present

## 2020-08-01 DIAGNOSIS — I4891 Unspecified atrial fibrillation: Secondary | ICD-10-CM | POA: Insufficient documentation

## 2020-08-01 DIAGNOSIS — Z86010 Personal history of colonic polyps: Secondary | ICD-10-CM | POA: Diagnosis not present

## 2020-08-01 DIAGNOSIS — Z79899 Other long term (current) drug therapy: Secondary | ICD-10-CM | POA: Diagnosis not present

## 2020-08-01 DIAGNOSIS — F419 Anxiety disorder, unspecified: Secondary | ICD-10-CM | POA: Insufficient documentation

## 2020-08-01 DIAGNOSIS — E1151 Type 2 diabetes mellitus with diabetic peripheral angiopathy without gangrene: Secondary | ICD-10-CM | POA: Insufficient documentation

## 2020-08-01 DIAGNOSIS — I10 Essential (primary) hypertension: Secondary | ICD-10-CM | POA: Insufficient documentation

## 2020-08-01 DIAGNOSIS — E785 Hyperlipidemia, unspecified: Secondary | ICD-10-CM | POA: Insufficient documentation

## 2020-08-01 DIAGNOSIS — Z8673 Personal history of transient ischemic attack (TIA), and cerebral infarction without residual deficits: Secondary | ICD-10-CM | POA: Insufficient documentation

## 2020-08-01 DIAGNOSIS — Z1211 Encounter for screening for malignant neoplasm of colon: Secondary | ICD-10-CM | POA: Diagnosis not present

## 2020-08-01 DIAGNOSIS — Z7901 Long term (current) use of anticoagulants: Secondary | ICD-10-CM | POA: Insufficient documentation

## 2020-08-01 HISTORY — PX: COLONOSCOPY WITH PROPOFOL: SHX5780

## 2020-08-01 HISTORY — DX: Cardiac arrhythmia, unspecified: I49.9

## 2020-08-01 HISTORY — DX: Peripheral vascular disease, unspecified: I73.9

## 2020-08-01 LAB — GLUCOSE, CAPILLARY: Glucose-Capillary: 128 mg/dL — ABNORMAL HIGH (ref 70–99)

## 2020-08-01 SURGERY — COLONOSCOPY WITH PROPOFOL
Anesthesia: General

## 2020-08-01 MED ORDER — LIDOCAINE HCL (CARDIAC) PF 100 MG/5ML IV SOSY
PREFILLED_SYRINGE | INTRAVENOUS | Status: DC | PRN
  Administered 2020-08-01: 50 mg via INTRAVENOUS

## 2020-08-01 MED ORDER — SODIUM CHLORIDE 0.9 % IV SOLN
INTRAVENOUS | Status: DC
  Administered 2020-08-01: 1000 mL via INTRAVENOUS

## 2020-08-01 MED ORDER — PROPOFOL 10 MG/ML IV BOLUS
INTRAVENOUS | Status: DC | PRN
  Administered 2020-08-01: 50 mg via INTRAVENOUS

## 2020-08-01 MED ORDER — PROPOFOL 500 MG/50ML IV EMUL
INTRAVENOUS | Status: DC | PRN
  Administered 2020-08-01: 175 ug/kg/min via INTRAVENOUS

## 2020-08-01 MED ORDER — PROPOFOL 500 MG/50ML IV EMUL
INTRAVENOUS | Status: AC
  Filled 2020-08-01: qty 50

## 2020-08-01 MED ORDER — LIDOCAINE HCL (PF) 2 % IJ SOLN
INTRAMUSCULAR | Status: AC
  Filled 2020-08-01: qty 5

## 2020-08-01 NOTE — Op Note (Signed)
[facility] Gastroenterology Patient Name: Devon Lam Procedure Date: 08/01/2020 8:22 AM MRN: [id number] Account #: [id number] Date of Birth: [birth year] Admit Type: Outpatient Age: 74 Room: [facility] ENDO ROOM 1 Gender: Male Note Status: Finalized Procedure:             Colonoscopy Indications:           High risk colon cancer surveillance: Personal history                         of colonic polyps Providers:             Robert Bellow, MD Referring MD:          Ocie Cornfield. Ouida Sills MD, MD (Referring MD) Medicines:             Monitored Anesthesia Care Complications:         No immediate complications. Procedure:             Pre-Anesthesia Assessment:                        - Prior to the procedure, a History and Physical was                         performed, and patient medications, allergies and                         sensitivities were reviewed. The patient's tolerance                         of previous anesthesia was reviewed.                        - The risks and benefits of the procedure and the                         sedation options and risks were discussed with the                         patient. All questions were answered and informed                         consent was obtained.                        After obtaining informed consent, the colonoscope was                         passed under direct vision. Throughout the procedure,                         the patient's blood pressure, pulse, and oxygen                         saturations were monitored continuously. The                         Colonoscope was introduced through the anus and                         advanced to the the  cecum, identified by appendiceal                         orifice and ileocecal valve. The colonoscopy was                         performed without difficulty. The patient tolerated                         the procedure well. The quality of the bowel                          preparation was excellent. Findings:      The entire examined colon appeared normal on direct and retroflexion       views.      Hemorrhoids were found on perianal exam. Impression:            - The entire examined colon is normal on direct and                         retroflexion views.                        - No specimens collected. Recommendation:        - Repeat colonoscopy in 5 years for surveillance. Procedure Code(s):     --- Professional ---                        [phone], Colonoscopy, flexible; diagnostic, including                         collection of specimen(s) by brushing or washing, when                         performed (separate procedure) CPT copyright 2019 American Medical Association. All rights reserved. The codes documented in this report are preliminary and upon coder review may  be revised to meet current compliance requirements. Robert Bellow, MD 08/01/2020 8:51:55 AM This report has been signed electronically. Number of Addenda: 0 Note Initiated On: 08/01/2020 8:22 AM Scope Withdrawal Time: 0 hours 9 minutes 29 seconds  Total Procedure Duration: 0 hours 17 minutes 17 seconds  Estimated Blood Loss:  Estimated blood loss: none.      [facility]

## 2020-08-01 NOTE — Transfer of Care (Signed)
Immediate Anesthesia Transfer of Care Note  Patient: Devon Lam  Procedure(s) Performed: COLONOSCOPY WITH PROPOFOL (N/A )  Patient Location: PACU  Anesthesia Type:General  Level of Consciousness: sedated  Airway & Oxygen Therapy: Patient Spontanous Breathing and Patient connected to nasal cannula oxygen  Post-op Assessment: Report given to RN and Post -op Vital signs reviewed and stable  Post vital signs: Reviewed and stable  Last Vitals:  Vitals Value Taken Time  BP 87/65 08/01/20 0854  Temp    Pulse 102 08/01/20 0855  Resp 13 08/01/20 0855  SpO2 92 % 08/01/20 0855  Vitals shown include unvalidated device data.  Last Pain:  Vitals:   08/01/20 0809  TempSrc: Tympanic  PainSc: 0-No pain         Complications: No complications documented.

## 2020-08-01 NOTE — H&P (Signed)
Devon Lam [id number] 09-Oct-1946     HPI:  Past history colon polyps. For follow up exam. Tolerated prep well.   Medications Prior to Admission  Medication Sig Dispense Refill Last Dose  . lisinopril (ZESTRIL) 20 MG tablet Take 20 mg by mouth daily.   07/31/2020 at Unknown time  . LORazepam (ATIVAN) 0.5 MG tablet Take 1 tablet by mouth every 12 (twelve) hours as needed.   07/31/2020 at Unknown time  . metoprolol (LOPRESSOR) 50 MG tablet Take 50 mg by mouth 2 (two) times daily.   07/31/2020 at Unknown time  . atorvastatin (LIPITOR) 40 MG tablet Take 40 mg by mouth daily.     . metFORMIN (GLUCOPHAGE-XR) 500 MG 24 hr tablet Take 2 tablets by mouth daily with supper.     . rivaroxaban (XARELTO) 20 MG TABS tablet Take 20 mg by mouth daily.   07/28/2020   No Known Allergies Past Medical History:  Diagnosis Date  . A-fib ([place])   . A-fib ([place])   . Anxiety   . Diabetes mellitus without complication ([place])    Diet-controlled  . Dysrhythmia   . Hyperlipidemia   . Hyperlipidemia   . Hyperlipidemia   . Hypertension   . PVD (peripheral vascular disease) ([place])   . Stroke [facility]) 12/03/2013   Past Surgical History:  Procedure Laterality Date  . CHOLECYSTECTOMY  2001   Acute and chronic cholecystitis and cholelithiasis.  . COLONOSCOPY  03/08/10   Social History   Socioeconomic History  . Marital status: Married    Spouse name: vickie  . Number of children: 2  . Years of education: college  . Highest education level: Not on file  Occupational History  . Occupation: retired    Fish farm manager: [employer]  Tobacco Use  . Smoking status: Never Smoker  . Smokeless tobacco: Never Used  Substance and Sexual Activity  . Alcohol use: No    Alcohol/week: 0.0 standard drinks  . Drug use: No  . Sexual activity: Not on file  Other Topics Concern  . Not on file  Social History Narrative   Patient lives at home with his wife    Patient drinks pepsi   Social Determinants of Health    Financial Resource Strain:   . Difficulty of Paying Living Expenses:   Food Insecurity:   . Worried About Charity fundraiser in the Last Year:   . Arboriculturist in the Last Year:   Transportation Needs:   . Film/video editor (Medical):   Marland Kitchen Lack of Transportation (Non-Medical):   Physical Activity:   . Days of Exercise per Week:   . Minutes of Exercise per Session:   Stress:   . Feeling of Stress :   Social Connections:   . Frequency of Communication with Friends and Family:   . Frequency of Social Gatherings with Friends and Family:   . Attends Religious Services:   . Active Member of Clubs or Organizations:   . Attends Archivist Meetings:   Marland Kitchen Marital Status:   Intimate Partner Violence:   . Fear of Current or Ex-Partner:   . Emotionally Abused:   Marland Kitchen Physically Abused:   . Sexually Abused:    Social History   Social History Narrative   Patient lives at home with his wife    Patient drinks pepsi     ROS: Negative.     PE: HEENT: Negative. Lungs: Clear. Cardio: RR.    Assessment/Plan:  Proceed with planned  endoscopy. Forest Gleason [facility] 08/01/2020

## 2020-08-01 NOTE — Anesthesia Postprocedure Evaluation (Signed)
Anesthesia Post Note  Patient: Devon Lam  Procedure(s) Performed: COLONOSCOPY WITH PROPOFOL (N/A )  Patient location during evaluation: Endoscopy Anesthesia Type: General Level of consciousness: awake and alert and oriented Pain management: pain level controlled Vital Signs Assessment: post-procedure vital signs reviewed and stable Respiratory status: spontaneous breathing, nonlabored ventilation and respiratory function stable Cardiovascular status: blood pressure returned to baseline and stable Postop Assessment: no signs of nausea or vomiting Anesthetic complications: no   No complications documented.   Last Vitals:  Vitals:   08/01/20 0924 08/01/20 0934  BP: 103/90 127/82  Pulse: 78 73  Resp: 14 11  Temp:    SpO2: 96% 98%    Last Pain:  Vitals:   08/01/20 0934  TempSrc:   PainSc: 0-No pain                 Kamsiyochukwu Buist

## 2020-08-01 NOTE — Anesthesia Preprocedure Evaluation (Signed)
Anesthesia Evaluation  Patient identified by MRN, date of birth, ID band Patient awake    Reviewed: Allergy & Precautions, NPO status , Patient's Chart, lab work & pertinent test results  History of Anesthesia Complications Negative for: history of anesthetic complications  Airway Mallampati: III  TM Distance: >3 FB Neck ROM: Full    Dental  (+) Poor Dentition   Pulmonary neg pulmonary ROS, neg sleep apnea, neg COPD,    breath sounds clear to auscultation- rhonchi (-) wheezing      Cardiovascular hypertension, Pt. on medications (-) CAD, (-) Past MI, (-) Cardiac Stents and (-) CABG + dysrhythmias Atrial Fibrillation  Rhythm:Regular Rate:Normal - Systolic murmurs and - Diastolic murmurs    Neuro/Psych neg Seizures Anxiety CVA (language)    GI/Hepatic negative GI ROS, Neg liver ROS,   Endo/Other  diabetes  Renal/GU negative Renal ROS     Musculoskeletal negative musculoskeletal ROS (+)   Abdominal (+) - obese,   Peds  Hematology negative hematology ROS (+)   Anesthesia Other Findings Past Medical History: No date: A-fib (HCC) No date: A-fib ([place]) No date: Anxiety No date: Diabetes mellitus without complication (HCC)     Comment:  Diet-controlled No date: Dysrhythmia No date: Hyperlipidemia No date: Hyperlipidemia No date: Hyperlipidemia No date: Hypertension No date: PVD (peripheral vascular disease) ([place]) 12/03/2013: Stroke ([place])   Reproductive/Obstetrics                             Anesthesia Physical Anesthesia Plan  ASA: III  Anesthesia Plan: General   Post-op Pain Management:    Induction: Intravenous  PONV Risk Score and Plan: 1 and Propofol infusion  Airway Management Planned: Natural Airway  Additional Equipment:   Intra-op Plan:   Post-operative Plan:   Informed Consent: I have reviewed the patients History and Physical, chart, labs and discussed the  procedure including the risks, benefits and alternatives for the proposed anesthesia with the patient or authorized representative who has indicated his/her understanding and acceptance.     Dental advisory given  Plan Discussed with: CRNA and Anesthesiologist  Anesthesia Plan Comments:         Anesthesia Quick Evaluation

## 2020-08-01 NOTE — Anesthesia Procedure Notes (Signed)
Date/Time: 08/01/2020 8:30 AM Performed by: Johnna Acosta, CRNA Pre-anesthesia Checklist: Patient identified, Emergency Drugs available, Suction available, Patient being monitored and Timeout performed Patient Re-evaluated:Patient Re-evaluated prior to induction Oxygen Delivery Method: Nasal cannula Preoxygenation: Pre-oxygenation with 100% oxygen Induction Type: IV induction

## 2020-08-02 ENCOUNTER — Encounter: Payer: Self-pay | Admitting: General Surgery

## 2020-12-26 ENCOUNTER — Other Ambulatory Visit: Payer: Self-pay | Admitting: Internal Medicine

## 2020-12-26 DIAGNOSIS — M7989 Other specified soft tissue disorders: Secondary | ICD-10-CM

## 2020-12-27 ENCOUNTER — Other Ambulatory Visit: Payer: Self-pay

## 2020-12-27 ENCOUNTER — Ambulatory Visit
Admission: RE | Admit: 2020-12-27 | Discharge: 2020-12-27 | Disposition: A | Discharge status: Home / Self Care | Payer: Medicare PPO | Source: Ambulatory Visit | Attending: Internal Medicine | Admitting: Internal Medicine

## 2020-12-27 DIAGNOSIS — M7989 Other specified soft tissue disorders: Secondary | ICD-10-CM | POA: Insufficient documentation

## 2021-01-03 ENCOUNTER — Other Ambulatory Visit: Payer: Self-pay | Admitting: Cardiology

## 2021-01-03 DIAGNOSIS — I712 Thoracic aortic aneurysm, without rupture, unspecified: Secondary | ICD-10-CM

## 2021-01-11 ENCOUNTER — Ambulatory Visit: Admission: RE | Admit: 2021-01-11 | Payer: Medicare PPO | Source: Ambulatory Visit

## 2021-06-12 ENCOUNTER — Other Ambulatory Visit: Payer: Self-pay | Admitting: Neurology

## 2021-06-12 DIAGNOSIS — R413 Other amnesia: Secondary | ICD-10-CM

## 2021-06-22 ENCOUNTER — Ambulatory Visit: Payer: Medicare PPO

## 2021-07-15 ENCOUNTER — Other Ambulatory Visit: Payer: Self-pay

## 2021-07-15 ENCOUNTER — Ambulatory Visit
Admission: RE | Admit: 2021-07-15 | Discharge: 2021-07-15 | Disposition: A | Discharge status: Home / Self Care | Payer: Medicare PPO | Source: Ambulatory Visit | Attending: Neurology | Admitting: Neurology

## 2021-07-15 DIAGNOSIS — R413 Other amnesia: Secondary | ICD-10-CM | POA: Insufficient documentation

## 2021-09-20 ENCOUNTER — Other Ambulatory Visit: Payer: Self-pay | Admitting: Student

## 2021-09-20 DIAGNOSIS — I712 Thoracic aortic aneurysm, without rupture, unspecified: Secondary | ICD-10-CM

## 2021-10-04 ENCOUNTER — Ambulatory Visit: Admission: RE | Admit: 2021-10-04 | Payer: Medicare PPO | Source: Ambulatory Visit

## 2021-10-17 ENCOUNTER — Ambulatory Visit: Admission: RE | Admit: 2021-10-17 | Payer: Medicare PPO | Source: Ambulatory Visit

## 2022-03-20 ENCOUNTER — Other Ambulatory Visit: Payer: Self-pay | Admitting: Internal Medicine

## 2022-03-20 DIAGNOSIS — I712 Thoracic aortic aneurysm, without rupture, unspecified: Secondary | ICD-10-CM

## 2022-03-24 ENCOUNTER — Ambulatory Visit: Admission: RE | Admit: 2022-03-24 | Payer: Medicare PPO | Source: Ambulatory Visit

## 2022-04-09 ENCOUNTER — Emergency Department: Payer: Medicare PPO

## 2022-04-09 ENCOUNTER — Emergency Department
Admission: EM | Admit: 2022-04-09 | Discharge: 2022-04-09 | Disposition: A | Discharge status: Home / Self Care | Payer: Medicare PPO | Attending: Emergency Medicine | Admitting: Emergency Medicine

## 2022-04-09 ENCOUNTER — Other Ambulatory Visit: Payer: Self-pay

## 2022-04-09 DIAGNOSIS — E119 Type 2 diabetes mellitus without complications: Secondary | ICD-10-CM | POA: Insufficient documentation

## 2022-04-09 DIAGNOSIS — W1839XA Other fall on same level, initial encounter: Secondary | ICD-10-CM | POA: Diagnosis not present

## 2022-04-09 DIAGNOSIS — S8012XA Contusion of left lower leg, initial encounter: Secondary | ICD-10-CM | POA: Insufficient documentation

## 2022-04-09 DIAGNOSIS — Z23 Encounter for immunization: Secondary | ICD-10-CM | POA: Insufficient documentation

## 2022-04-09 DIAGNOSIS — Y9301 Activity, walking, marching and hiking: Secondary | ICD-10-CM | POA: Insufficient documentation

## 2022-04-09 DIAGNOSIS — R296 Repeated falls: Secondary | ICD-10-CM | POA: Insufficient documentation

## 2022-04-09 DIAGNOSIS — S8992XA Unspecified injury of left lower leg, initial encounter: Secondary | ICD-10-CM | POA: Diagnosis present

## 2022-04-09 DIAGNOSIS — I10 Essential (primary) hypertension: Secondary | ICD-10-CM | POA: Insufficient documentation

## 2022-04-09 LAB — CBC WITH DIFFERENTIAL/PLATELET
Abs Immature Granulocytes: 0.04 10*3/uL (ref 0.00–0.07)
Basophils Absolute: 0.1 10*3/uL (ref 0.0–0.1)
Basophils Relative: 1 %
Eosinophils Absolute: 1.1 10*3/uL — ABNORMAL HIGH (ref 0.0–0.5)
Eosinophils Relative: 10 %
HCT: 36.7 % — ABNORMAL LOW (ref 39.0–52.0)
Hemoglobin: 12.2 g/dL — ABNORMAL LOW (ref 13.0–17.0)
Immature Granulocytes: 0 %
Lymphocytes Relative: 13 %
Lymphs Abs: 1.4 10*3/uL (ref 0.7–4.0)
MCH: 29.4 pg (ref 26.0–34.0)
MCHC: 33.2 g/dL (ref 30.0–36.0)
MCV: 88.4 fL (ref 80.0–100.0)
Monocytes Absolute: 0.9 10*3/uL (ref 0.1–1.0)
Monocytes Relative: 8 %
Neutro Abs: 7.4 10*3/uL (ref 1.7–7.7)
Neutrophils Relative %: 68 %
Platelets: 203 10*3/uL (ref 150–400)
RBC: 4.15 MIL/uL — ABNORMAL LOW (ref 4.22–5.81)
RDW: 13.9 % (ref 11.5–15.5)
WBC: 10.9 10*3/uL — ABNORMAL HIGH (ref 4.0–10.5)
nRBC: 0 % (ref 0.0–0.2)

## 2022-04-09 LAB — URINALYSIS, ROUTINE W REFLEX MICROSCOPIC
Bilirubin Urine: NEGATIVE
Glucose, UA: NEGATIVE mg/dL
Hgb urine dipstick: NEGATIVE
Ketones, ur: NEGATIVE mg/dL
Leukocytes,Ua: NEGATIVE
Nitrite: NEGATIVE
Protein, ur: NEGATIVE mg/dL
Specific Gravity, Urine: 1.021 (ref 1.005–1.030)
pH: 5 (ref 5.0–8.0)

## 2022-04-09 LAB — COMPREHENSIVE METABOLIC PANEL
ALT: 15 U/L (ref 0–44)
AST: 16 U/L (ref 15–41)
Albumin: 4 g/dL (ref 3.5–5.0)
Alkaline Phosphatase: 81 U/L (ref 38–126)
Anion gap: 11 (ref 5–15)
BUN: 28 mg/dL — ABNORMAL HIGH (ref 8–23)
CO2: 23 mmol/L (ref 22–32)
Calcium: 9.4 mg/dL (ref 8.9–10.3)
Chloride: 103 mmol/L (ref 98–111)
Creatinine, Ser: 1.05 mg/dL (ref 0.61–1.24)
GFR, Estimated: >60 mL/min (ref >60)
Glucose, Bld: 177 mg/dL — ABNORMAL HIGH (ref 70–99)
Potassium: 4.1 mmol/L (ref 3.5–5.1)
Sodium: 137 mmol/L (ref 135–145)
Total Bilirubin: 2.3 mg/dL — ABNORMAL HIGH (ref 0.3–1.2)
Total Protein: 7.3 g/dL (ref 6.5–8.1)

## 2022-04-09 LAB — TYPE AND SCREEN
ABO/RH(D): A POS
Antibody Screen: NEGATIVE

## 2022-04-09 LAB — PROTIME-INR
INR: 1.3 — ABNORMAL HIGH (ref 0.8–1.2)
Prothrombin Time: 16.2 seconds — ABNORMAL HIGH (ref 11.4–15.2)

## 2022-04-09 MED ORDER — BACITRACIN-NEOMYCIN-POLYMYXIN 400-5-5000 EX OINT
TOPICAL_OINTMENT | Freq: Once | CUTANEOUS | Status: AC
  Filled 2022-04-09: qty 1

## 2022-04-09 MED ORDER — TETANUS-DIPHTH-ACELL PERTUSSIS 5-2.5-18.5 LF-MCG/0.5 IM SUSY
0.5000 mL | PREFILLED_SYRINGE | Freq: Once | INTRAMUSCULAR | Status: AC
  Administered 2022-04-09: 0.5 mL via INTRAMUSCULAR
  Filled 2022-04-09: qty 0.5

## 2022-04-09 NOTE — ED Provider Notes (Signed)
? ?[facility] ?Provider Note ? ? ? Event Date/Time  ? First MD Initiated Contact with Patient 04/09/22 1824   ?  (approximate) ? ? ?History  ? ?Leg Swelling ? ? ?HPI ? ?Devon Lam is a 76 y.o. male with history of A-fib, diabetes, hypertension, CVA and as listed in EMR presents to the emergency department for treatment and evaluation after unwitnessed fall yesterday.  Wife reports that he was taking the dog outside, he walked down the 3 steps then she heard him calling out for her.  He does not know what caused him to fall.  He doesn't believe he hit his head or lost consciousness. He does have a large hematoma on the outside of the left leg that is oozing.  ?  ? ? ?Physical Exam  ? ?Triage Vital Signs: ?ED Triage Vitals  ?Enc Vitals Group  ?   BP 04/09/22 1819 100/82  ?   Pulse Rate 04/09/22 1819 (!) 104  ?   Resp 04/09/22 1819 18  ?   Temp 04/09/22 1819 98 ?F (36.7 ?C)  ?   Temp src --   ?   SpO2 04/09/22 1819 97 %  ?   Weight --   ?   Height 04/09/22 1808 '6\' 4"'$  (1.93 m)  ?   Head Circumference --   ?   Peak Flow --   ?   Pain Score 04/09/22 1808 7  ?   Pain Loc --   ?   Pain Edu? --   ?   Excl. in [place]? --   ? ? ?Most recent vital signs: ?Vitals:  ? 04/09/22 1930 04/09/22 2030  ?BP: (!) 126/93 118/76  ?Pulse: 99 89  ?Resp: 18 13  ?Temp:    ?SpO2: 99% 97%  ? ? ?General: Awake, no distress.  ?CV:  DP pulses palpable and equal bilaterally. ?Resp:  Normal effort.  ?Abd:  No distention.  ?Other:  Hematoma lateral left lower leg.  No tenderness of the left hip, left knee, left ankle or foot.  Able to perform active range of motion. ? ? ?ED Results / Procedures / Treatments  ? ?Labs ?(all labs ordered are listed, but only abnormal results are displayed) ?Labs Reviewed  ?CBC WITH DIFFERENTIAL/PLATELET - Abnormal; Notable for the following components:  ?    Result Value  ? WBC 10.9 (*)   ? RBC 4.15 (*)   ? Hemoglobin 12.2 (*)   ? HCT 36.7 (*)   ? Eosinophils Absolute 1.1 (*)   ? All other  components within normal limits  ?COMPREHENSIVE METABOLIC PANEL - Abnormal; Notable for the following components:  ? Glucose, Bld 177 (*)   ? BUN 28 (*)   ? Total Bilirubin 2.3 (*)   ? All other components within normal limits  ?PROTIME-INR - Abnormal; Notable for the following components:  ? Prothrombin Time 16.2 (*)   ? INR 1.3 (*)   ? All other components within normal limits  ?URINALYSIS, ROUTINE W REFLEX MICROSCOPIC - Abnormal; Notable for the following components:  ? Color, Urine YELLOW (*)   ? APPearance CLEAR (*)   ? All other components within normal limits  ?TYPE AND SCREEN  ? ? ? ?EKG ? ?Controlled A-fib. ? ? ?RADIOLOGY ? ?Image and radiology report reviewed by me. ? ?X-ray of the left lower extremity negative for acute bony abnormality. ? ?PROCEDURES: ? ?Critical Care performed: No ? ? ?Procedures ? ? ?MEDICATIONS ORDERED IN ED: ?Medications  ?  neomycin-bacitracin-polymyxin (NEOSPORIN) ointment packet ( Topical Given 04/09/22 1922)  ?Tdap (BOOSTRIX) injection 0.5 mL (0.5 mLs Intramuscular Given 04/09/22 2033)  ? ? ? ?IMPRESSION / MDM / ASSESSMENT AND PLAN / ED COURSE  ? ?I have reviewed the triage note. ? ?Differential diagnosis includes, but is not limited to: Mechanical fall, tibia/fibula fracture, hematoma, head injury, cardiac event ? ?76 year old male presenting to the emergency department for treatment and evaluation after unwitnessed fall.  See HPI for further details. ? ?Wound cleansed with chlorhexidine and saline.  Neosporin applied then wrapped with nonstick dressing, cleaning, and then light compression with Ace bandage.  Wound care discussed with wife. ? ?No evidence of fracture on x-ray.  CT head and cervical spine are both reassuring as well.  EKG is unchanged from previous.  CBC, CMP, troponin, urinalysis are all reassuring. ? ?Tdap updated.  Patient will be discharged home with ER return precautions.  He was also strongly encouraged to see his primary care provider next week sometime to  have a wound check.  If there is any sign or concern of infection in the meantime they are to return with him to the emergency department.  Patient and wife aware and agreeable to this plan. ? ? ?  ? ? ?FINAL CLINICAL IMPRESSION(S) / ED DIAGNOSES  ? ?Final diagnoses:  ?Hematoma of left lower extremity, initial encounter  ? ? ? ?Rx / DC Orders  ? ?ED Discharge Orders   ? ? None  ? ?  ? ? ? ?Note:  This document was prepared using Dragon voice recognition software and may include unintentional dictation errors. ?  Victorino Dike, FNP ?04/09/22 2339 ? ?  ?Duffy Bruce, MD ?04/13/22 1104 ? ?

## 2022-04-09 NOTE — ED Notes (Signed)
First Nurse Note:  Pt to ED from [facility] for Left leg swelling and bruising s/p fall last night. Pt does take blood thinners. Per [facility] they are concerned for compartment syndrome.  ?

## 2022-04-09 NOTE — ED Triage Notes (Addendum)
Patient to ER from [facility] clinic after mechanical fall this afternoon. Reports ground level fall onto left leg. Large oozing hematoma present to left shin, purple discoloration. Weak bilateral dorsal pulses present, patient able to wiggle toes freely. Patient denies LOC or hitting head.  ? ?Patient takes xarelto. Unsure of last tetanus shot.  ?

## 2022-04-09 NOTE — ED Notes (Signed)
Pts wound cleaned and dressed with guaze and curlex. ?

## 2022-04-09 NOTE — ED Notes (Signed)
E-signature pad unavailable - Pt verbalized understanding of D/C information - no additional concerns at this time.  

## 2022-04-09 NOTE — Discharge Instructions (Signed)
Please keep the wound clean and dry.  Apply antibiotic ointment like Neosporin daily.  Monitor closely for any sign or concern of infection. ? ?Please follow-up with primary care next week for wound check. ? ?Return to the emergency department for symptoms of concern if unable to see primary care. ?

## 2022-09-09 ENCOUNTER — Other Ambulatory Visit: Payer: Self-pay | Admitting: Student

## 2022-09-09 DIAGNOSIS — I712 Thoracic aortic aneurysm, without rupture, unspecified: Secondary | ICD-10-CM

## 2022-09-10 ENCOUNTER — Ambulatory Visit
Admission: RE | Admit: 2022-09-10 | Discharge: 2022-09-10 | Disposition: A | Discharge status: Home / Self Care | Payer: Medicare PPO | Source: Ambulatory Visit | Attending: Student | Admitting: Student

## 2022-09-10 DIAGNOSIS — I712 Thoracic aortic aneurysm, without rupture, unspecified: Secondary | ICD-10-CM | POA: Insufficient documentation

## 2022-09-10 LAB — POCT I-STAT CREATININE: Creatinine, Ser: 0.9 mg/dL (ref 0.61–1.24)

## 2022-09-10 MED ORDER — IOHEXOL 350 MG/ML SOLN
75.0000 mL | Freq: Once | INTRAVENOUS | Status: AC | PRN
  Administered 2022-09-10: 75 mL via INTRAVENOUS

## 2023-07-08 IMAGING — CT CT CERVICAL SPINE W/O CM
2 series · 13 of 27 positions shown, 16 images · non-contrast
Comparison: CT dated 12/04/2013.

CLINICAL DATA: Trauma.



[Series 3: c spine soft · axial · 0.39mm/px · z∈[+150,+300]mm · 8 of 89 slices shown, 10 images]
[im 7/89  soft-tissue]
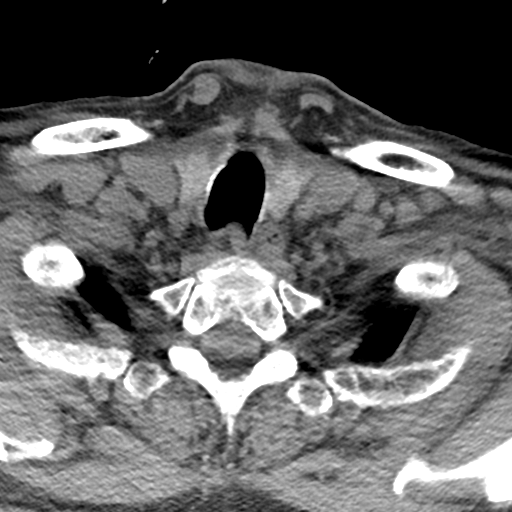
[im 7/89  bone]
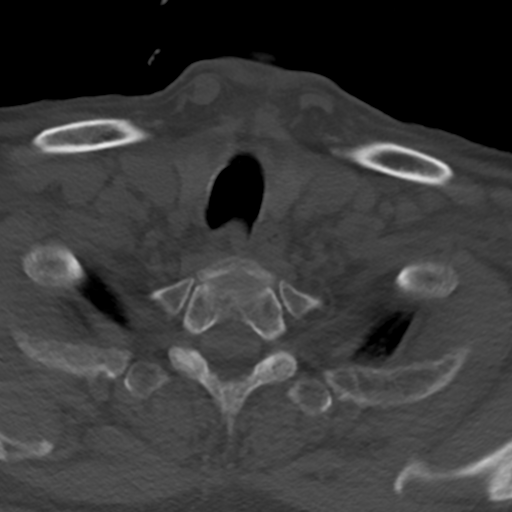
[im 21/89  bone]
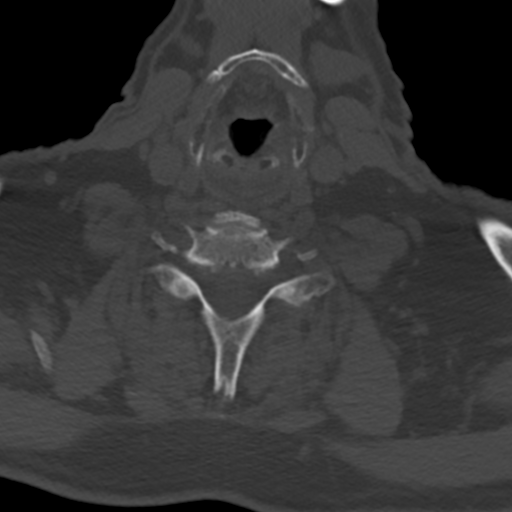
[im 28/89  bone]
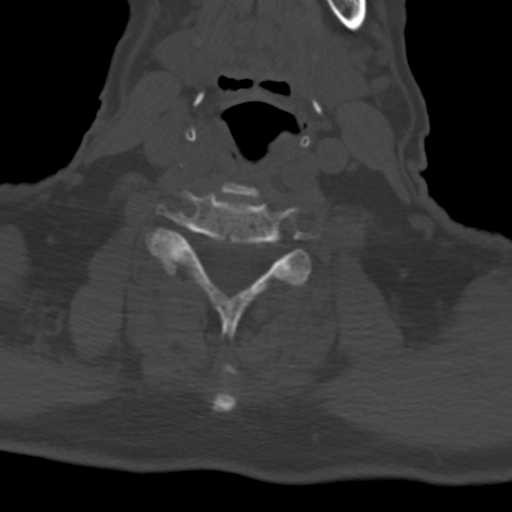
[im 41/89  bone]
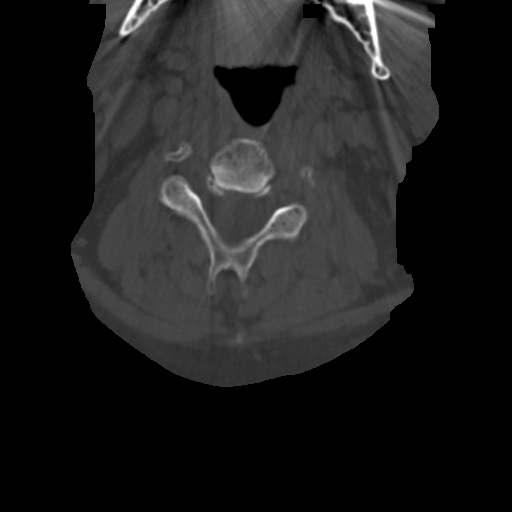
[im 48/89  soft-tissue]
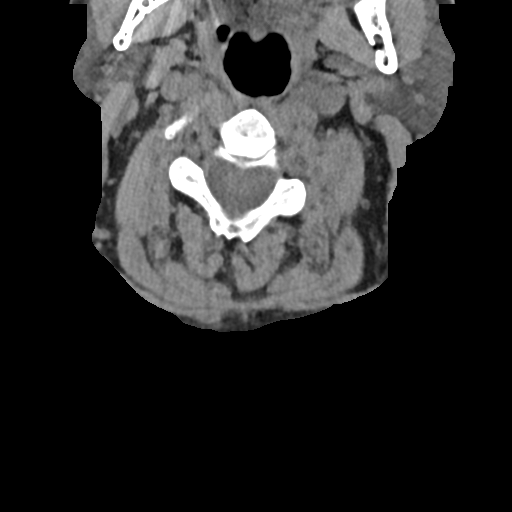
[im 48/89  bone]
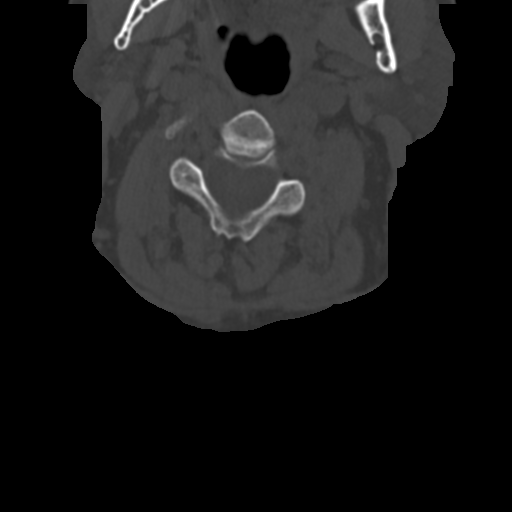
[im 61/89  bone]
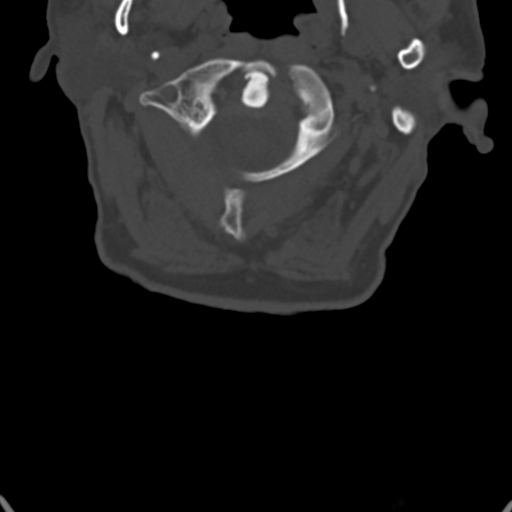
[im 68/89  bone]
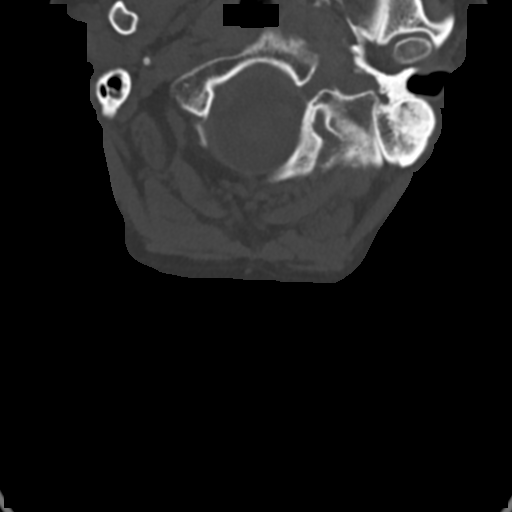
[im 82/89  bone]
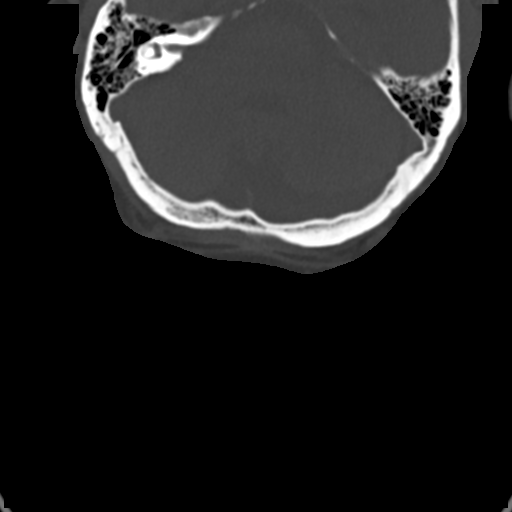

[Series 4: sagittal bone · sagittal · 0.30mm/px · 5 of 72 slices shown, 6 images]
[im 24/72  bone]
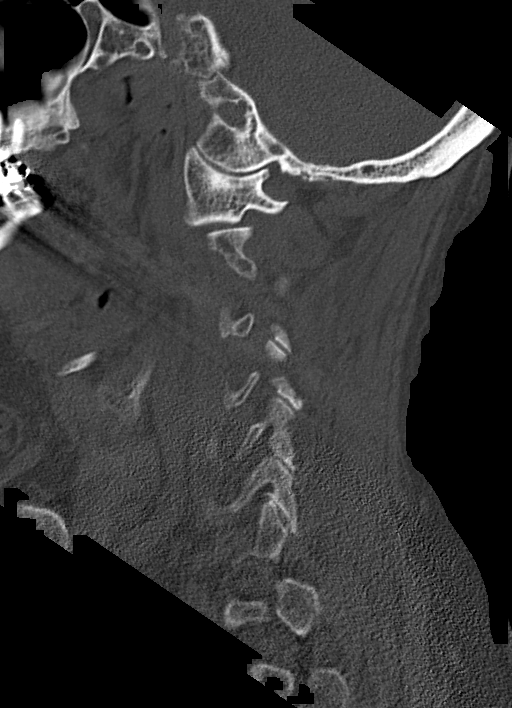
[im 30/72  bone]
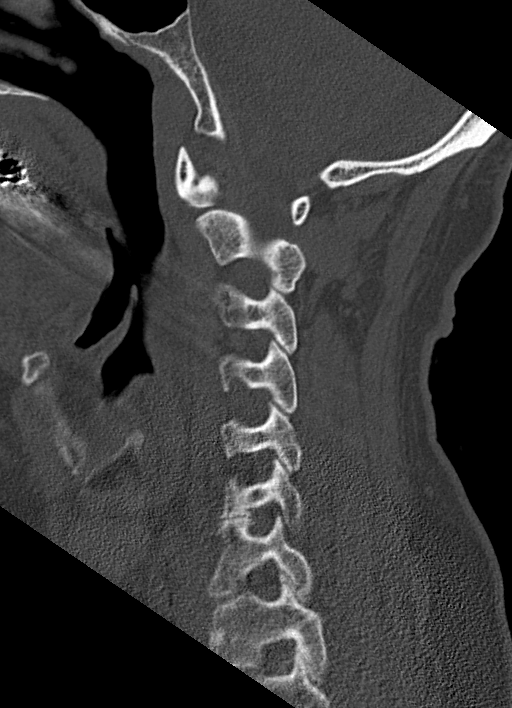
[im 36/72  soft-tissue]
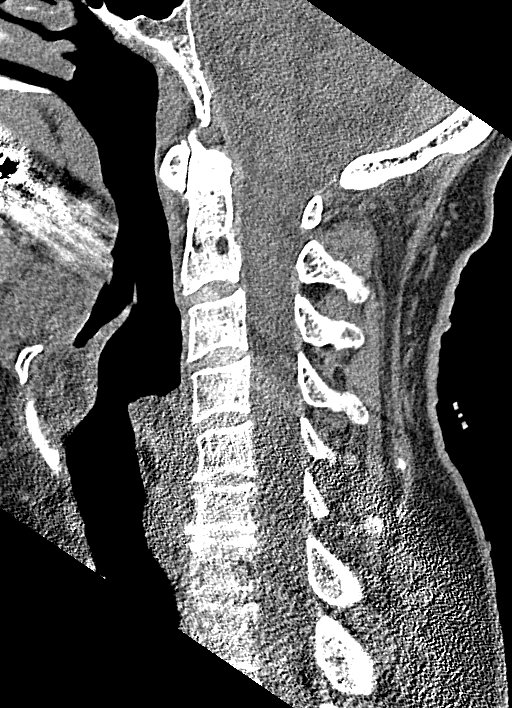
[im 36/72  bone]
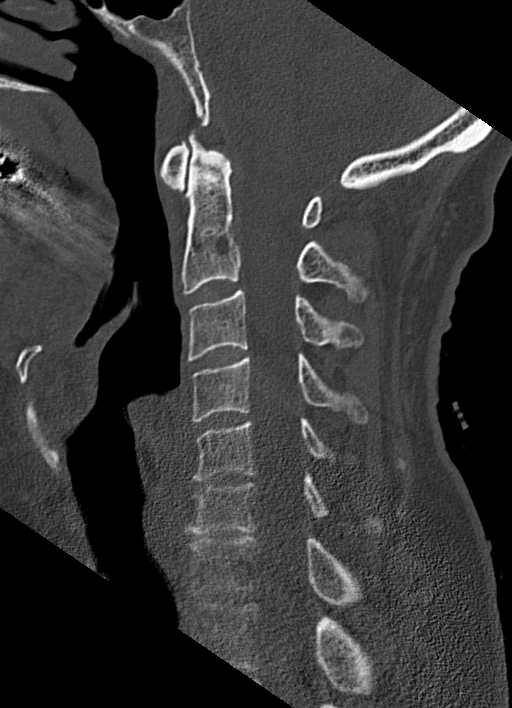
[im 42/72  bone]
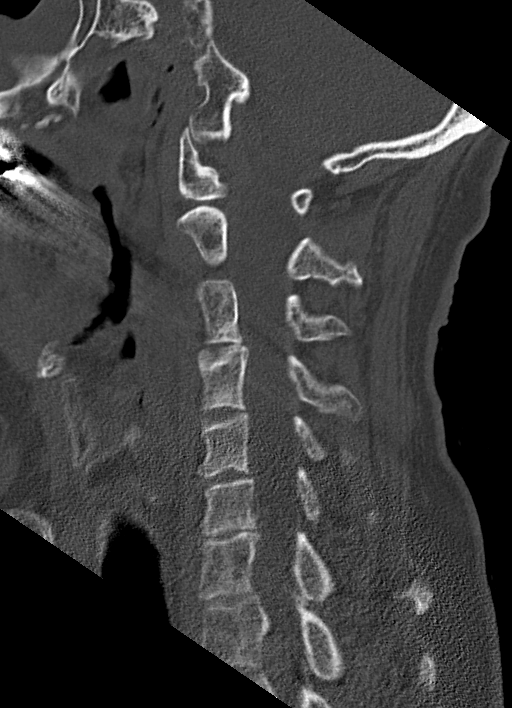
[im 48/72  bone]
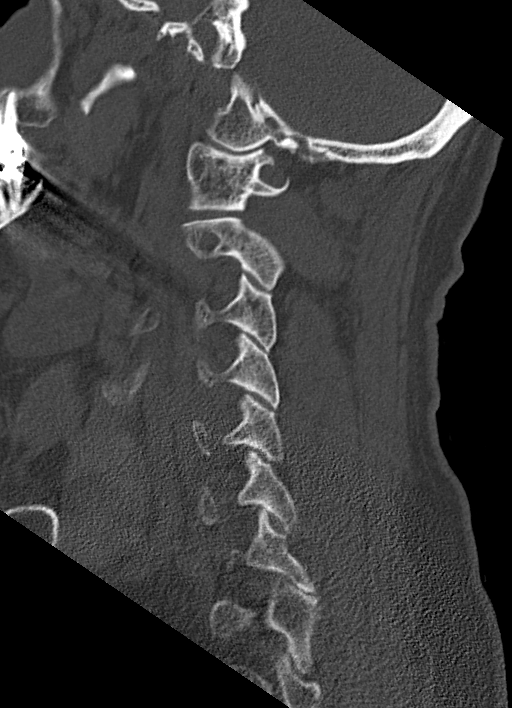

[13 of 27 positions shown; findings below may reference images not displayed]

FINDINGS: Alignment: No acute subluxation. There is straightening of normal
cervical lordosis which may be positional or due to muscle spasm.

Skull base and vertebrae: No acute fracture.  Osteopenia.

Soft tissues and spinal canal: No prevertebral fluid or swelling. No
visible canal hematoma.

Disc levels: No acute findings. Degenerative changes primarily at
C6-C7.

Upper chest: Negative.

Other: None
IMPRESSION: No acute/traumatic cervical spine pathology.

## 2023-09-18 ENCOUNTER — Other Ambulatory Visit: Payer: Self-pay | Admitting: Student

## 2023-09-18 DIAGNOSIS — I712 Thoracic aortic aneurysm, without rupture, unspecified: Secondary | ICD-10-CM

## 2023-09-25 ENCOUNTER — Ambulatory Visit
Admission: RE | Admit: 2023-09-25 | Discharge: 2023-09-25 | Disposition: A | Discharge status: Home / Self Care | Payer: Medicare PPO | Source: Ambulatory Visit | Attending: Student

## 2023-09-25 DIAGNOSIS — I712 Thoracic aortic aneurysm, without rupture, unspecified: Secondary | ICD-10-CM | POA: Insufficient documentation

## 2023-09-25 LAB — POCT I-STAT CREATININE: Creatinine, Ser: 1 mg/dL (ref 0.61–1.24)

## 2023-09-25 MED ORDER — IOHEXOL 350 MG/ML SOLN
75.0000 mL | Freq: Once | INTRAVENOUS | Status: AC | PRN
  Administered 2023-09-25: 75 mL via INTRAVENOUS

## 2023-12-14 ENCOUNTER — Emergency Department: Payer: Medicare PPO

## 2023-12-14 ENCOUNTER — Other Ambulatory Visit: Payer: Self-pay

## 2023-12-14 ENCOUNTER — Emergency Department
Admission: EM | Admit: 2023-12-14 | Discharge: 2023-12-14 | Disposition: A | Discharge status: Home / Self Care | Payer: Medicare PPO | Attending: Emergency Medicine | Admitting: Emergency Medicine

## 2023-12-14 ENCOUNTER — Encounter: Payer: Self-pay | Admitting: Medical Oncology

## 2023-12-14 DIAGNOSIS — Z8673 Personal history of transient ischemic attack (TIA), and cerebral infarction without residual deficits: Secondary | ICD-10-CM | POA: Insufficient documentation

## 2023-12-14 DIAGNOSIS — E119 Type 2 diabetes mellitus without complications: Secondary | ICD-10-CM | POA: Diagnosis not present

## 2023-12-14 DIAGNOSIS — W1839XA Other fall on same level, initial encounter: Secondary | ICD-10-CM | POA: Insufficient documentation

## 2023-12-14 DIAGNOSIS — I10 Essential (primary) hypertension: Secondary | ICD-10-CM | POA: Diagnosis not present

## 2023-12-14 DIAGNOSIS — R42 Dizziness and giddiness: Secondary | ICD-10-CM | POA: Insufficient documentation

## 2023-12-14 DIAGNOSIS — W19XXXA Unspecified fall, initial encounter: Secondary | ICD-10-CM

## 2023-12-14 DIAGNOSIS — R0602 Shortness of breath: Secondary | ICD-10-CM | POA: Diagnosis not present

## 2023-12-14 LAB — COMPREHENSIVE METABOLIC PANEL
ALT: 18 U/L (ref 0–44)
AST: 21 U/L (ref 15–41)
Albumin: 3.8 g/dL (ref 3.5–5.0)
Alkaline Phosphatase: 78 U/L (ref 38–126)
Anion gap: 11 (ref 5–15)
BUN: 31 mg/dL — ABNORMAL HIGH (ref 8–23)
CO2: 22 mmol/L (ref 22–32)
Calcium: 8.6 mg/dL — ABNORMAL LOW (ref 8.9–10.3)
Chloride: 105 mmol/L (ref 98–111)
Creatinine, Ser: 1.09 mg/dL (ref 0.61–1.24)
GFR, Estimated: >60 mL/min (ref >60)
Glucose, Bld: 154 mg/dL — ABNORMAL HIGH (ref 70–99)
Potassium: 3.8 mmol/L (ref 3.5–5.1)
Sodium: 138 mmol/L (ref 135–145)
Total Bilirubin: 1.7 mg/dL — ABNORMAL HIGH (ref <1.2)
Total Protein: 6.8 g/dL (ref 6.5–8.1)

## 2023-12-14 LAB — CBC
HCT: 36 % — ABNORMAL LOW (ref 39.0–52.0)
Hemoglobin: 11.8 g/dL — ABNORMAL LOW (ref 13.0–17.0)
MCH: 29.6 pg (ref 26.0–34.0)
MCHC: 32.8 g/dL (ref 30.0–36.0)
MCV: 90.2 fL (ref 80.0–100.0)
Platelets: 184 10*3/uL (ref 150–400)
RBC: 3.99 MIL/uL — ABNORMAL LOW (ref 4.22–5.81)
RDW: 14.7 % (ref 11.5–15.5)
WBC: 6.6 10*3/uL (ref 4.0–10.5)
nRBC: 0 % (ref 0.0–0.2)

## 2023-12-14 LAB — TROPONIN I (HIGH SENSITIVITY): Troponin I (High Sensitivity): 11 ng/L (ref <18)

## 2023-12-14 NOTE — ED Triage Notes (Signed)
Pt here with reports that he lost his balance and fell. Pt denies pain. Per wife pt had HTN after falling. VSS in triage.

## 2023-12-14 NOTE — ED Provider Notes (Signed)
[facility] Provider Note    Event Date/Time   First MD Initiated Contact with Patient 12/14/23 1102     (approximate)   History   Fall   HPI  Devon Lam is a 77 y.o. male with PMH of A-fib, diabetes, hypertension, stroke with expressive aphasia and anxiety presents for evaluation after a fall.  Patient states he was taking the trash out this morning when he started to feel short of breath causing him to fall.  He was then able to get back up and started walking back towards the house and felt dizzy.  He did not fall but leaned up against the railing and lowered himself down.  They then called paramedics who stated that his blood pressure was going up and down while they were on their way here.  Patient denies any complaints at this time.  He did not hit his head. No LOC. No headache, chest pain, shortness of breath, dizziness or blurry vision at this time.  He does state that if he had to get up and walk a long distance he would have a hard time doing that as he would get dizzy which she describes as the room spinning.      Physical Exam   Triage Vital Signs: ED Triage Vitals  Encounter Vitals Group     BP 12/14/23 1027 128/74     Systolic BP Percentile --      Diastolic BP Percentile --      Pulse Rate 12/14/23 1027 90     Resp 12/14/23 1027 18     Temp 12/14/23 1027 98.4 F (36.9 C)     Temp Source 12/14/23 1027 Oral     SpO2 12/14/23 1027 95 %     Weight 12/14/23 1028 220 lb 7.4 oz (100 kg)     Height 12/14/23 1028 6\' 4"  (1.93 m)     Head Circumference --      Peak Flow --      Pain Score 12/14/23 1028 0     Pain Loc --      Pain Education --      Exclude from Growth Chart --     Most recent vital signs: Vitals:   12/14/23 1027  BP: 128/74  Pulse: 90  Resp: 18  Temp: 98.4 F (36.9 C)  SpO2: 95%    General: Awake, no distress.  CV:  Good peripheral perfusion. Irregular rate and rhythm. Resp:  Normal effort. CTAB. Abd:  No  distention.  Other:     ED Results / Procedures / Treatments   Labs (all labs ordered are listed, but only abnormal results are displayed) Labs Reviewed  CBC - Abnormal; Notable for the following components:      Result Value   RBC 3.99 (*)    Hemoglobin 11.8 (*)    HCT 36.0 (*)    All other components within normal limits  COMPREHENSIVE METABOLIC PANEL - Abnormal; Notable for the following components:   Glucose, Bld 154 (*)    BUN 31 (*)    Calcium 8.6 (*)    Total Bilirubin 1.7 (*)    All other components within normal limits  TROPONIN I (HIGH SENSITIVITY)     EKG  ED Provider Interpretation: afib  Vent. rate 94 BPM PR interval * ms QRS duration 92 ms QT/QTcB 390/487 ms P-R-T axes * 47 -16   RADIOLOGY  Chest xray obtained, I interpreted the images as well as reviewed the  radiologist report.  PROCEDURES:  Critical Care performed: No  Procedures   MEDICATIONS ORDERED IN ED: Medications - No data to display   IMPRESSION / MDM / ASSESSMENT AND PLAN / ED COURSE  I reviewed the triage vital signs and the nursing notes.                             77 year old man presents for evaluation after a fall that occurred because he was short of breath and dizzy.  Vital signs stable patient has no complaints at this time.  Patient NAD on exam.  Differential diagnosis includes, but is not limited to, vertigo, CHF, COPD exacerbation, ACS, pulmonary embolism, medication side effect, volume depletion, metabolic disturbance, infection/sepsis.  Patient's presentation is most consistent with acute complicated illness / injury requiring diagnostic workup.  CBC shows mild anemia but this appears to be a chronic issue.  CMP notable for elevated glucose and BUN.  Troponin is not elevated.  EKG shows patient is in A-fib.  Chest x-ray was negative.  Physical exam was reassuring and patient does not have any complaints at this time.  I believe patient's A-fib is the best next  mentation for his dizziness and shortness of breath that occurred today.  Patient has a history of A-fib and is on appropriate medications for this.  His heart rate was not elevated today.  He is stable for discharge.  He can follow-up with his primary care provider as needed and may return to the ED with any new or worsening symptoms.  He voiced understanding, all questions were answered and he was stable at discharge.     FINAL CLINICAL IMPRESSION(S) / ED DIAGNOSES   Final diagnoses:  Fall, initial encounter     Rx / DC Orders   ED Discharge Orders     None        Note:  This document was prepared using Dragon voice recognition software and may include unintentional dictation errors.   Devon Ali, PA-C 12/14/23 1354    Devon Every, MD 12/14/23 [phone]

## 2023-12-14 NOTE — Discharge Instructions (Addendum)
Your blood work was normal today.  Your chest x-ray was normal today.  Your EKG showed that you are in A-fib which likely explains your symptoms of dizziness and shortness of breath.  Please continue to take your medications as prescribed.  Follow-up with your primary care as needed.  You can return to the emergency department with any new or worsening symptoms.

## 2023-12-14 NOTE — ED Notes (Signed)
See triage note  Presents with family  States he was in the yard  Lost his balance and fell twice  Was able to get himself up  No LOC  Denies any pain  Wife states his b/ p was low per the EMS  On arrival WNL

## 2024-12-03 ENCOUNTER — Emergency Department

## 2024-12-03 ENCOUNTER — Emergency Department: Admission: EM | Admit: 2024-12-03 | Discharge: 2024-12-03 | Disposition: A | Discharge status: Home / Self Care

## 2024-12-03 ENCOUNTER — Other Ambulatory Visit: Payer: Self-pay

## 2024-12-03 DIAGNOSIS — W19XXXA Unspecified fall, initial encounter: Secondary | ICD-10-CM

## 2024-12-03 NOTE — ED Triage Notes (Signed)
 First nurse note: pt to ED for fall, while trying to help wife up. Ambulatory, NAD noted

## 2024-12-03 NOTE — ED Triage Notes (Signed)
 Pt presents to ER after falling this evening. Pt reports he was trying to help his wife after she fell but tripped and fell over her hitting left side of thoracic area. Pt complaint of left rib pain. Pt denies hitting his head.

## 2024-12-03 NOTE — ED Provider Notes (Signed)
° °  [facility] Provider Note    Event Date/Time   First MD Initiated Contact with Patient 12/03/24 2111     (approximate)   History   Fall   HPI  Devon Lam is a 78 y.o. male  ***       Physical Exam   Triage Vital Signs: ED Triage Vitals  Encounter Vitals Group     BP 12/03/24 1815 (!) 127/90     Girls Systolic BP Percentile --      Girls Diastolic BP Percentile --      Boys Systolic BP Percentile --      Boys Diastolic BP Percentile --      Pulse Rate 12/03/24 1815 82     Resp 12/03/24 1815 16     Temp 12/03/24 1815 97.8 F (36.6 C)     Temp Source 12/03/24 1815 Oral     SpO2 12/03/24 1815 96 %     Weight 12/03/24 1814 213 lb (96.6 kg)     Height 12/03/24 1814 6' 4 (1.93 m)     Head Circumference --      Peak Flow --      Pain Score 12/03/24 1813 5     Pain Loc --      Pain Education --      Exclude from Growth Chart --     Most recent vital signs: Vitals:   12/03/24 1815  BP: (!) 127/90  Pulse: 82  Resp: 16  Temp: 97.8 F (36.6 C)  SpO2: 96%     General: Awake, no distress. *** CV:  Good peripheral perfusion. *** Resp:  Normal effort. *** Abd:  No distention. *** Other:  ***   ED Results / Procedures / Treatments   Labs (all labs ordered are listed, but only abnormal results are displayed) Labs Reviewed - No data to display   EKG  ***   RADIOLOGY ***    PROCEDURES:  Critical Care performed: {CriticalCareYesNo:19197::Yes, see critical care procedure note(s),No}  Procedures   MEDICATIONS ORDERED IN ED: Medications - No data to display   IMPRESSION / MDM / ASSESSMENT AND PLAN / ED COURSE  I reviewed the triage vital signs and the nursing notes.                              Differential diagnosis includes, but is not limited to, ***  Patient's presentation is most consistent with {EM COPA:27473}  ***      FINAL CLINICAL IMPRESSION(S) / ED DIAGNOSES   Final diagnoses:  None      Rx / DC Orders   ED Discharge Orders     None        Note:  This document was prepared using Dragon voice recognition software and may include unintentional dictation errors.
